# Patient Record
Sex: Male | Born: 1970 | Race: White | Hispanic: No | Marital: Married | Smoking: Never smoker
Health system: Southern US, Community
[De-identification: ages and names within clinical notes are randomized; demographics above are authoritative.]

## PROBLEM LIST (undated history)

## (undated) DIAGNOSIS — R112 Nausea with vomiting, unspecified: Secondary | ICD-10-CM

## (undated) DIAGNOSIS — I10 Essential (primary) hypertension: Secondary | ICD-10-CM

## (undated) DIAGNOSIS — T4145XA Adverse effect of unspecified anesthetic, initial encounter: Secondary | ICD-10-CM

## (undated) DIAGNOSIS — J45909 Unspecified asthma, uncomplicated: Secondary | ICD-10-CM

## (undated) DIAGNOSIS — Z9889 Other specified postprocedural states: Secondary | ICD-10-CM

## (undated) DIAGNOSIS — E119 Type 2 diabetes mellitus without complications: Secondary | ICD-10-CM

## (undated) DIAGNOSIS — B019 Varicella without complication: Secondary | ICD-10-CM

## (undated) DIAGNOSIS — R52 Pain, unspecified: Secondary | ICD-10-CM

## (undated) DIAGNOSIS — T8859XA Other complications of anesthesia, initial encounter: Secondary | ICD-10-CM

## (undated) DIAGNOSIS — G8929 Other chronic pain: Secondary | ICD-10-CM

## (undated) DIAGNOSIS — M549 Dorsalgia, unspecified: Secondary | ICD-10-CM

## (undated) HISTORY — PX: MYRINGOTOMY WITH TUBE PLACEMENT: SHX5663

## (undated) HISTORY — DX: Varicella without complication: B01.9

---
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
## 2014-03-05 LAB — CBC AND DIFFERENTIAL
HEMATOCRIT: 48 % (ref 41–53)
Hemoglobin: 15.3 g/dL (ref 13.5–17.5)
WBC: 8.8 10*3/mL

## 2014-03-05 LAB — LIPID PANEL
Cholesterol: 147 mg/dL (ref 0–200)
HDL: 39 mg/dL (ref 35–70)
LDL CALC: 76 mg/dL
Triglycerides: 162 mg/dL — AB (ref 40–160)

## 2014-03-05 LAB — TSH: TSH: 1.05 u[IU]/mL (ref 0.41–5.90)

## 2014-03-05 LAB — HEMOGLOBIN A1C: HEMOGLOBIN A1C: 8.6 % — AB (ref 4.0–6.0)

## 2014-03-05 LAB — BASIC METABOLIC PANEL
BUN: 13 mg/dL (ref 4–21)
CREATININE: 0.9 mg/dL (ref 0.6–1.3)
GLUCOSE: 252 mg/dL
Potassium: 4.6 mmol/L (ref 3.4–5.3)
Sodium: 133 mmol/L — AB (ref 137–147)

## 2014-03-05 LAB — HEPATIC FUNCTION PANEL
ALT: 42 U/L — AB (ref 10–40)
AST: 28 U/L (ref 14–40)
BILIRUBIN, TOTAL: 0.4 mg/dL

## 2014-11-12 ENCOUNTER — Encounter (HOSPITAL_COMMUNITY): Payer: Self-pay | Admitting: Nurse Practitioner

## 2014-11-12 ENCOUNTER — Emergency Department (HOSPITAL_COMMUNITY): Payer: 59

## 2014-11-12 ENCOUNTER — Emergency Department (HOSPITAL_COMMUNITY)
Admission: EM | Admit: 2014-11-12 | Discharge: 2014-11-13 | Disposition: A | Discharge status: Home / Self Care | Payer: 59 | Attending: Emergency Medicine | Admitting: Emergency Medicine

## 2014-11-12 DIAGNOSIS — I10 Essential (primary) hypertension: Secondary | ICD-10-CM | POA: Insufficient documentation

## 2014-11-12 DIAGNOSIS — R110 Nausea: Secondary | ICD-10-CM | POA: Diagnosis not present

## 2014-11-12 DIAGNOSIS — E1165 Type 2 diabetes mellitus with hyperglycemia: Secondary | ICD-10-CM | POA: Diagnosis not present

## 2014-11-12 DIAGNOSIS — J45901 Unspecified asthma with (acute) exacerbation: Secondary | ICD-10-CM | POA: Insufficient documentation

## 2014-11-12 DIAGNOSIS — R079 Chest pain, unspecified: Secondary | ICD-10-CM

## 2014-11-12 HISTORY — DX: Unspecified asthma, uncomplicated: J45.909

## 2014-11-12 HISTORY — DX: Type 2 diabetes mellitus without complications: E11.9

## 2014-11-12 HISTORY — DX: Essential (primary) hypertension: I10

## 2014-11-12 LAB — I-STAT TROPONIN, ED
Troponin i, poc: 0.01 ng/mL (ref 0.00–0.08)
Troponin i, poc: 0.01 ng/mL (ref 0.00–0.08)

## 2014-11-12 LAB — BASIC METABOLIC PANEL
Anion gap: 9 (ref 5–15)
BUN: 16 mg/dL (ref 6–23)
CALCIUM: 9.3 mg/dL (ref 8.4–10.5)
CHLORIDE: 102 mmol/L (ref 96–112)
CO2: 26 mmol/L (ref 19–32)
Creatinine, Ser: 1.23 mg/dL (ref 0.50–1.35)
GFR calc Af Amer: 83 mL/min — ABNORMAL LOW (ref >90)
GFR calc non Af Amer: 71 mL/min — ABNORMAL LOW (ref >90)
GLUCOSE: 296 mg/dL — AB (ref 70–99)
Potassium: 4.2 mmol/L (ref 3.5–5.1)
SODIUM: 137 mmol/L (ref 135–145)

## 2014-11-12 LAB — CBC
HCT: 43.6 % (ref 39.0–52.0)
Hemoglobin: 14.8 g/dL (ref 13.0–17.0)
MCH: 29.3 pg (ref 26.0–34.0)
MCHC: 33.9 g/dL (ref 30.0–36.0)
MCV: 86.3 fL (ref 78.0–100.0)
Platelets: 280 10*3/uL (ref 150–400)
RBC: 5.05 MIL/uL (ref 4.22–5.81)
RDW: 13.6 % (ref 11.5–15.5)
WBC: 8.6 10*3/uL (ref 4.0–10.5)

## 2014-11-12 NOTE — ED Notes (Signed)
Pt reports chest, upper abd, bilateral shoulder pain onset this afternoon while he was at rest. He denies any other complaints. Pain has persisted since onset

## 2014-11-12 NOTE — ED Provider Notes (Signed)
CSN: [id number]     Arrival date & time 11/12/14  1957 History   First MD Initiated Contact with Patient 11/12/14 2237     Chief Complaint  Patient presents with  . Chest Pain     (Consider location/radiation/quality/duration/timing/severity/associated sxs/prior Treatment) HPI  Pt is a 44yo male with hx of HTN, asthma, and NIDDM, presenting to ED with c/o intermittent diffuse chest pain that has been dull and aching, associated nausea and diaphoresis.  Pain was tightness in nature, 3/10 at worst.  Pain did radiate more to his left shoulder and upper back.  Pain has resolved after getting into exam room.  Pt states he was sitting when symptoms started earlier this afternoon. Reports feeling well yesterday.  Denies previous hx of CAD. States he moved to the area about 1 year ago from [place], has not f/u with a PCP but has his first appointment with [facility] primary care in April.  States his glucose normally is 140s but found out today it was elevated, 296. Pt reports having been on the [employer] and "doing really well" but admits this week he has "fallen off the diet" and eating "whatever he wants"  Which he believes has caused his glucose to be elevated and believes this too may be why he feels bad today. Denies fever, chills, vomiting or diarrhea.  Denies recent cough or congestion.  Pt reports his grandfather had an MI before age 39.   Past Medical History  Diagnosis Date  . Asthma   . Hypertension   . Diabetes mellitus without complication    Past Surgical History  Procedure Laterality Date  . Knee surgery     History reviewed. No pertinent family history. History  Substance Use Topics  . Smoking status: Never Smoker   . Smokeless tobacco: Current User    Types: Chew  . Alcohol Use: No    Review of Systems  Constitutional: Positive for diaphoresis. Negative for fever, chills and fatigue.  Respiratory: Negative for cough and shortness of breath.   Cardiovascular: Positive for  chest pain.  Gastrointestinal: Positive for nausea. Negative for vomiting, abdominal pain and diarrhea.  Musculoskeletal: Negative for back pain and neck pain.  All other systems reviewed and are negative.     Allergies  Review of patient's allergies indicates no known allergies.  Home Medications   Prior to Admission medications   Not on File   BP 122/62 mmHg  Pulse 75  Resp 13  SpO2 97% Physical Exam  Constitutional: He appears well-developed and well-nourished.  HENT:  Head: Normocephalic and atraumatic.  Eyes: Conjunctivae are normal. No scleral icterus.  Neck: Normal range of motion.  Cardiovascular: Normal rate, regular rhythm and normal heart sounds.   Pulmonary/Chest: Effort normal. No respiratory distress. He has wheezes. He has no rales. He exhibits no tenderness.  No respiratory distress, able to speak in full sentences w/o difficulty. Lungs: faint expiratory wheeze in upper lung fields. No accessory muscle use.   Abdominal: Soft. Bowel sounds are normal. He exhibits no distension and no mass. There is no tenderness. There is no rebound and no guarding.  Musculoskeletal: Normal range of motion.  Neurological: He is alert.  Skin: Skin is warm and dry.  Nursing note and vitals reviewed.   ED Course  Procedures (including critical care time) Labs Review Labs Reviewed  BASIC METABOLIC PANEL - Abnormal; Notable for the following:    Glucose, Bld 296 (*)    GFR calc non Af Amer 71 (*)  GFR calc Af Amer 83 (*)    All other components within normal limits  CBC  I-STAT TROPOININ, ED  Randolm Idol, ED    Imaging Review Dg Chest 2 View  11/12/2014   CLINICAL DATA:  Chest pain.  History of asthma.  EXAM: CHEST  2 VIEW  COMPARISON:  None.  FINDINGS: The heart size and mediastinal contours are within normal limits. Both lungs are clear. The visualized skeletal structures are unremarkable.  IMPRESSION: No active cardiopulmonary disease.   Electronically Signed    By: Markus Daft M.D.   On: 11/12/2014 20:45     EKG Interpretation   Date/Time:  Wednesday November 12 2014 20:07:17 EST Ventricular Rate:  96 PR Interval:  152 QRS Duration: 86 QT Interval:  322 QTC Calculation: 406 R Axis:   84 Text Interpretation:  Normal sinus rhythm with sinus arrhythmia Possible  Anterior infarct , age undetermined T wave abnormality, consider inferior  ischemia Abnormal ECG No old tracing to compare Confirmed by Debby Freiberg [phone]) on 11/12/2014 11:01:52 PM      MDM   Final diagnoses:  Chest pain, unspecified chest pain type  Hyperglycemia due to type 2 diabetes mellitus    Pt is a 44yo male with hx of NIDDM, HTN, and asthma c/o diffuse dull chest pain that radiated to left shoulder and upper back. No previous hx of CAD.  FH of CAD.  Pt does have faint wheeze on exam, pt anxious to go home after getting in exam room. Declined duoneb tx stating he has given himself a breathing treatment before bed in the past and it makes it difficult for him to sleep. States he does have plenty of inhalers at home he can use.   Initial cardiac workup Troponin negative for elevation, CXR: no active cardiopulmonary disease, EKG: normal sinus rhythm with sinus arrhythmia possible anterior infarct, age undetermined T wave abnormality, consider inferior ischemia. No old tracing to compare.  CBC and BMP only remarkable for elevated glucose of 296. Pt attributes to eating "whatever he wants" recently.  Hx of NIDDM.   Pt anxious to leave after initial cardiac workup. Expressed concern for CP that had associated nausea and diaphoresis.  Able to have pt stay for delta troponin.  If negative, pt will be discharged home but advised to call [facility] tomorrow to schedule a f/u appointment.    Delta troponin: negative for elevation. Will discharge pt home. Return precautions provided. Pt verbalized understanding and agreement with tx plan including f/u with cardiology and PCP.        Noland Fordyce, PA-C 11/12/14 2358  Debby Freiberg, MD 11/17/14 [phone]

## 2014-12-26 ENCOUNTER — Ambulatory Visit: Payer: Self-pay | Admitting: Family Medicine

## 2015-02-14 ENCOUNTER — Encounter (HOSPITAL_COMMUNITY): Payer: Self-pay | Admitting: Emergency Medicine

## 2015-02-14 ENCOUNTER — Emergency Department (HOSPITAL_COMMUNITY)
Admission: EM | Admit: 2015-02-14 | Discharge: 2015-02-14 | Disposition: A | Discharge status: Home / Self Care | Payer: 59 | Attending: Emergency Medicine | Admitting: Emergency Medicine

## 2015-02-14 DIAGNOSIS — I10 Essential (primary) hypertension: Secondary | ICD-10-CM | POA: Insufficient documentation

## 2015-02-14 DIAGNOSIS — E119 Type 2 diabetes mellitus without complications: Secondary | ICD-10-CM | POA: Diagnosis not present

## 2015-02-14 DIAGNOSIS — R4182 Altered mental status, unspecified: Secondary | ICD-10-CM | POA: Diagnosis present

## 2015-02-14 DIAGNOSIS — F1110 Opioid abuse, uncomplicated: Secondary | ICD-10-CM | POA: Diagnosis not present

## 2015-02-14 DIAGNOSIS — J45909 Unspecified asthma, uncomplicated: Secondary | ICD-10-CM | POA: Diagnosis not present

## 2015-02-14 DIAGNOSIS — F1310 Sedative, hypnotic or anxiolytic abuse, uncomplicated: Secondary | ICD-10-CM | POA: Insufficient documentation

## 2015-02-14 DIAGNOSIS — G8929 Other chronic pain: Secondary | ICD-10-CM | POA: Diagnosis not present

## 2015-02-14 DIAGNOSIS — F1190 Opioid use, unspecified, uncomplicated: Secondary | ICD-10-CM

## 2015-02-14 DIAGNOSIS — R5383 Other fatigue: Secondary | ICD-10-CM | POA: Insufficient documentation

## 2015-02-14 DIAGNOSIS — Z9189 Other specified personal risk factors, not elsewhere classified: Secondary | ICD-10-CM

## 2015-02-14 HISTORY — DX: Other chronic pain: G89.29

## 2015-02-14 HISTORY — DX: Dorsalgia, unspecified: M54.9

## 2015-02-14 LAB — ACETAMINOPHEN LEVEL

## 2015-02-14 LAB — COMPREHENSIVE METABOLIC PANEL WITH GFR
ALT: 40 U/L (ref 17–63)
AST: 30 U/L (ref 15–41)
Albumin: 4.3 g/dL (ref 3.5–5.0)
Alkaline Phosphatase: 69 U/L (ref 38–126)
Anion gap: 9 (ref 5–15)
BUN: 16 mg/dL (ref 6–20)
CO2: 27 mmol/L (ref 22–32)
Calcium: 9.2 mg/dL (ref 8.9–10.3)
Chloride: 104 mmol/L (ref 101–111)
Creatinine, Ser: 0.89 mg/dL (ref 0.61–1.24)
GFR calc Af Amer: >60 mL/min
GFR calc non Af Amer: >60 mL/min
Glucose, Bld: 185 mg/dL — ABNORMAL HIGH (ref 65–99)
Potassium: 4.1 mmol/L (ref 3.5–5.1)
Sodium: 140 mmol/L (ref 135–145)
Total Bilirubin: 0.5 mg/dL (ref 0.3–1.2)
Total Protein: 7.5 g/dL (ref 6.5–8.1)

## 2015-02-14 LAB — URINALYSIS, ROUTINE W REFLEX MICROSCOPIC
Bilirubin Urine: NEGATIVE
Glucose, UA: >1000 mg/dL — AB
Hgb urine dipstick: NEGATIVE
Ketones, ur: NEGATIVE mg/dL
Leukocytes, UA: NEGATIVE
Nitrite: NEGATIVE
Protein, ur: NEGATIVE mg/dL
SPECIFIC GRAVITY, URINE: 1.033 — AB (ref 1.005–1.030)
UROBILINOGEN UA: 0.2 mg/dL (ref 0.0–1.0)
pH: 6 (ref 5.0–8.0)

## 2015-02-14 LAB — CBC WITH DIFFERENTIAL/PLATELET
BASOS ABS: 0 10*3/uL (ref 0.0–0.1)
BASOS PCT: 0 % (ref 0–1)
EOS ABS: 0.3 10*3/uL (ref 0.0–0.7)
EOS PCT: 4 % (ref 0–5)
HCT: 43 % (ref 39.0–52.0)
Hemoglobin: 14.1 g/dL (ref 13.0–17.0)
Lymphocytes Relative: 26 % (ref 12–46)
Lymphs Abs: 1.7 10*3/uL (ref 0.7–4.0)
MCH: 29.1 pg (ref 26.0–34.0)
MCHC: 32.8 g/dL (ref 30.0–36.0)
MCV: 88.8 fL (ref 78.0–100.0)
Monocytes Absolute: 0.5 10*3/uL (ref 0.1–1.0)
Monocytes Relative: 8 % (ref 3–12)
NEUTROS ABS: 4.2 10*3/uL (ref 1.7–7.7)
Neutrophils Relative %: 62 % (ref 43–77)
Platelets: 244 10*3/uL (ref 150–400)
RBC: 4.84 MIL/uL (ref 4.22–5.81)
RDW: 13.2 % (ref 11.5–15.5)
WBC: 6.8 10*3/uL (ref 4.0–10.5)

## 2015-02-14 LAB — URINE MICROSCOPIC-ADD ON

## 2015-02-14 LAB — ETHANOL: Alcohol, Ethyl (B): <5 mg/dL (ref <5)

## 2015-02-14 LAB — RAPID URINE DRUG SCREEN, HOSP PERFORMED
Amphetamines: NOT DETECTED
Barbiturates: NOT DETECTED
Benzodiazepines: POSITIVE — AB
Cocaine: NOT DETECTED
Opiates: POSITIVE — AB
Tetrahydrocannabinol: NOT DETECTED

## 2015-02-14 LAB — CBG MONITORING, ED: Glucose-Capillary: 174 mg/dL — ABNORMAL HIGH (ref 65–99)

## 2015-02-14 LAB — AMMONIA: Ammonia: 36 umol/L — ABNORMAL HIGH (ref 9–35)

## 2015-02-14 MED ORDER — SODIUM CHLORIDE 0.9 % IV BOLUS (SEPSIS)
1000.0000 mL | Freq: Once | INTRAVENOUS | Status: AC
  Administered 2015-02-14: 1000 mL via INTRAVENOUS
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
## 2015-02-14 NOTE — Discharge Instructions (Signed)
If you were given medicines take as directed.  If you are on coumadin or contraceptives realize their levels and effectiveness is altered by many different medicines.  If you have any reaction (rash, tongues swelling, other) to the medicines stop taking and see a physician.    If your blood pressure was elevated in the ER make sure you follow up for management with a primary doctor or return for chest pain, shortness of breath or stroke symptoms.  Please follow up as directed and return to the ER or see a physician for new or worsening symptoms.  Thank you. Filed Vitals:   02/14/15 1823  BP: 145/86  Pulse: 96  Temp: 98.8 F (37.1 C)  TempSrc: Oral  Resp: 14  SpO2: 96%

## 2015-04-28 ENCOUNTER — Ambulatory Visit: Payer: 59 | Admitting: Family Medicine

## 2015-05-12 ENCOUNTER — Ambulatory Visit (INDEPENDENT_AMBULATORY_CARE_PROVIDER_SITE_OTHER): Payer: 59 | Admitting: Family Medicine

## 2015-05-12 ENCOUNTER — Encounter: Payer: Self-pay | Admitting: Family Medicine

## 2015-05-12 VITALS — BP 120/74 | HR 77 | Temp 97.7°F | Ht 68.0 in | Wt 334.0 lb

## 2015-05-12 DIAGNOSIS — I10 Essential (primary) hypertension: Secondary | ICD-10-CM | POA: Diagnosis not present

## 2015-05-12 DIAGNOSIS — N508 Other specified disorders of male genital organs: Secondary | ICD-10-CM

## 2015-05-12 DIAGNOSIS — G8929 Other chronic pain: Secondary | ICD-10-CM

## 2015-05-12 DIAGNOSIS — E119 Type 2 diabetes mellitus without complications: Secondary | ICD-10-CM | POA: Diagnosis not present

## 2015-05-12 DIAGNOSIS — R1030 Lower abdominal pain, unspecified: Secondary | ICD-10-CM

## 2015-05-12 DIAGNOSIS — M549 Dorsalgia, unspecified: Secondary | ICD-10-CM

## 2015-05-12 DIAGNOSIS — J4530 Mild persistent asthma, uncomplicated: Secondary | ICD-10-CM

## 2015-05-12 DIAGNOSIS — N50811 Right testicular pain: Secondary | ICD-10-CM

## 2015-05-12 DIAGNOSIS — R1031 Right lower quadrant pain: Secondary | ICD-10-CM

## 2015-05-12 DIAGNOSIS — J45909 Unspecified asthma, uncomplicated: Secondary | ICD-10-CM | POA: Insufficient documentation

## 2015-05-12 LAB — HEMOGLOBIN A1C: HEMOGLOBIN A1C: 8.5 % — AB (ref 4.6–6.5)

## 2015-05-12 LAB — COMPREHENSIVE METABOLIC PANEL
ALBUMIN: 4.2 g/dL (ref 3.5–5.2)
ALK PHOS: 72 U/L (ref 39–117)
ALT: 35 U/L (ref 0–53)
AST: 24 U/L (ref 0–37)
BUN: 18 mg/dL (ref 6–23)
CALCIUM: 9.2 mg/dL (ref 8.4–10.5)
CHLORIDE: 98 meq/L (ref 96–112)
CO2: 27 mEq/L (ref 19–32)
CREATININE: 0.91 mg/dL (ref 0.40–1.50)
GFR: 97.03 mL/min (ref >60.00)
Glucose, Bld: 291 mg/dL — ABNORMAL HIGH (ref 70–99)
POTASSIUM: 4.1 meq/L (ref 3.5–5.1)
Sodium: 133 mEq/L — ABNORMAL LOW (ref 135–145)
TOTAL PROTEIN: 7.4 g/dL (ref 6.0–8.3)
Total Bilirubin: 0.5 mg/dL (ref 0.2–1.2)

## 2015-05-12 MED ORDER — MONTELUKAST SODIUM 10 MG PO TABS: 10.0000 mg | ORAL_TABLET | Freq: Every day | ORAL | Status: DC

## 2015-05-12 MED ORDER — METFORMIN HCL 1000 MG PO TABS: 1000.0000 mg | ORAL_TABLET | Freq: Two times a day (BID) | ORAL | Status: DC

## 2015-05-12 MED ORDER — GLIMEPIRIDE 4 MG PO TABS: 4.0000 mg | ORAL_TABLET | Freq: Every morning | ORAL | Status: DC

## 2015-05-12 MED ORDER — BECLOMETHASONE DIPROPIONATE 80 MCG/ACT IN AERS: 1.0000 | INHALATION_SPRAY | Freq: Two times a day (BID) | RESPIRATORY_TRACT | Status: DC

## 2015-05-12 MED ORDER — LOSARTAN POTASSIUM 50 MG PO TABS: 50.0000 mg | ORAL_TABLET | Freq: Every day | ORAL | Status: DC

## 2015-05-12 MED ORDER — HYDROCHLOROTHIAZIDE 12.5 MG PO CAPS
12.5000 mg | ORAL_CAPSULE | Freq: Every day | ORAL | Status: DC
Start: 2015-05-12 — End: 2015-12-17

## 2015-05-12 MED ORDER — ALBUTEROL SULFATE HFA 108 (90 BASE) MCG/ACT IN AERS: 2.0000 | INHALATION_SPRAY | Freq: Four times a day (QID) | RESPIRATORY_TRACT | Status: DC | PRN

## 2015-05-12 NOTE — Assessment & Plan Note (Signed)
S: controlled. On Losartan 50mg , hctz 12.5mg  A/P:Continue current meds:  Refilled meds.

## 2015-05-12 NOTE — Assessment & Plan Note (Signed)
Chronic back pain- managed at pain management in [facility]. 15mg  oxycodone every 8 hours. Bulging disc. Not interested in surgery currently.

## 2015-05-12 NOTE — Progress Notes (Signed)
Henry Reddish, MD Phone: [phone]  Subjective:  Patient presents today to establish care. Moved to [facility] March 2015. Previously cared for in [place], [place]. Also has staff physician at [facility]. Chief complaint-noted.   See problem oriented charting5-6 hours sleep a night- had been on xanax in past ROS- no chest pain, shortness of breath, blurry vision, headache, nausea, hypoglycemia symptoms  The following were reviewed and entered/updated in epic: Past Medical History  Diagnosis Date  . Asthma   . Hypertension     hctz 12.5mg   . Diabetes mellitus without complication   . Chronic back pain   . Chicken pox    Patient Active Problem List   Diagnosis Date Noted  . Diabetes mellitus without complication     Priority: High  . Chronic back pain     Priority: High  . Asthma     Priority: Medium  . Hypertension     Priority: Medium   Past Surgical History  Procedure Laterality Date  . Knee surgery  1985    broken femur  . Appendectomy  1980    Family History  Problem Relation Age of Onset  . Hypertension Father   . Diabetes Father   . Diabetes Maternal Grandmother     type I  . Heart disease Paternal Grandfather     31s  . Liver cancer Paternal Grandfather     former drinker  . Heart disease Sister     MI? no autopsy before 50    Medications- reviewed and updated Current Outpatient Prescriptions  Medication Sig Dispense Refill  . aspirin 81 MG tablet Take 81 mg by mouth daily.    Marland Kitchen glimepiride (AMARYL) 4 MG tablet Take 1 tablet (4 mg total) by mouth every morning. 30 tablet 5  . hydrochlorothiazide (MICROZIDE) 12.5 MG capsule Take 1 capsule (12.5 mg total) by mouth daily. 30 capsule 5  . losartan (COZAAR) 50 MG tablet Take 1 tablet (50 mg total) by mouth daily. 30 tablet 5  . metFORMIN (GLUCOPHAGE) 1000 MG tablet Take 1 tablet (1,000 mg total) by mouth 2 (two) times daily. 60 tablet 5  . montelukast (SINGULAIR) 10 MG tablet Take 1 tablet (10 mg total) by  mouth at bedtime. 30 tablet 5  . albuterol (PROVENTIL HFA;VENTOLIN HFA) 108 (90 BASE) MCG/ACT inhaler Inhale 2 puffs into the lungs every 6 (six) hours as needed. Shortness of breath/wheezing 6.7 g 5  . beclomethasone (QVAR) 80 MCG/ACT inhaler Inhale 1 puff into the lungs 2 (two) times daily. 1 Inhaler 5  . oxyCODONE (ROXICODONE) 15 MG immediate release tablet Take 1 tablet by mouth every 8 (eight) hours as needed. Severe pain  0   No current facility-administered medications for this visit.   Allergies-reviewed and updated No Known Allergies  Social History   Social History  . Marital Status: Married    Spouse Name: N/A  . Number of Children: N/A  . Years of Education: N/A   Social History Main Topics  . Smoking status: Never Smoker   . Smokeless tobacco: Current User    Types: Chew  . Alcohol Use: No     Comment: rare social  . Drug Use: No  . Sexual Activity: Not Asked   Other Topics Concern  . None   Social History Narrative   Married. No children. 1 cat.       Works as Chief Financial Officer for [employer]. At [place]    ROS--See  HPI , otherwise full ROS was completed and negative except as noted above  Objective: BP 120/74 mmHg  Pulse 77  Temp(Src) 97.7 F (36.5 C)  Ht 5\' 8"  (1.727 m)  Wt 334 lb (151.501 kg)  BMI 50.80 kg/m2 Gen: NAD, resting comfortably HEENT: Mucous membranes are moist. Oropharynx normal. TM with some prior scarring.  Eyes: sclera and lids normal, PERRLA Neck: no thyromegaly, no cervical lymphadenopathy CV: RRR no murmurs rubs or gallops Lungs: CTAB no crackles, wheeze, rhonchi Abdomen: soft/nontender/nondistended/normal bowel sounds. No rebound or guarding.  GU: declines testicular exam, allows exam over boxers- tender to palpation to the right and above his penis in the groin, no issues on left side. No obvious hernia or bulge.  MSK- no pain with IR, ER, Flexion of hip.  Ext: no edema, 2+ PT pulses Skin:  warm, dry Small horned lesion with white material with flesh colored base on left neck Neuro: 5/5 strength in upper and lower extremities, normal gait, normal reflexes   Assessment/Plan:  Hypertension S: controlled. On Losartan 50mg , hctz 12.5mg  A/P:Continue current meds:  Refilled meds.   Diabetes mellitus without complication S: poor control. Sparing checks. Eats well around 100, eat poorly 200-240. Admits to eating very poorly recently. Not exercising- works 60 hours a week. Not interested in nutritionist. States he knows what he needs to do it is just boring and he doesn't want to do it Lab Results  Component Value Date   HGBA1C 8.5* 05/12/2015  A/P:  im surprised a1c is not worse than this with reported home readings. Continue amaryl 4mg , metformin 1g BID. Patient wants to buckle down on healthy lifestyles. If not <7.5 in 3 months, consider amaryl 8mg  vs. dpp4 or sglt2 inhibitor.    Chronic back pain Chronic back pain- managed at pain management in [facility]. 15mg  oxycodone every 8 hours. Bulging disc. Not interested in surgery currently.   Asthma S: controlled on singulair, albuterol prn with Albuterol 2x a month.  A/P: discussed likely did not need qvar given overall good control, could consider taking off med list in future, for now- refilled all 3 meds   Right testicular pain S: patient for 7-8 years has had right groin pain. Has been evaluated and no obvious hernia. He also states if he touches his right testicle it is painful.  A/P: oddly, patient did not want to do a GU exam today but was interested in urology referral for this chronic pain. Could be coming from the back but odd to involve testicle. Regardless, referred to urology at this time.     Get records from prior PCP. Patient may have pain management send [place] a copy of MRI as well.   Orders Placed This Encounter  Procedures  . Comprehensive metabolic panel    [place]  . Hemoglobin A1c    [place]  .  Ambulatory referral to Urology    Referral Priority:  Routine    Referral Type:  Consultation    Referral Reason:  Specialty Services Required    Requested Specialty:  Urology    Number of Visits Requested:  1    Meds ordered this encounter  Medications  . aspirin 81 MG tablet    Sig: Take 81 mg by mouth daily.  . montelukast (SINGULAIR) 10 MG tablet    Sig: Take 1 tablet (10 mg total) by mouth at bedtime.    Dispense:  30 tablet    Refill:  5  . metFORMIN (GLUCOPHAGE) 1000 MG tablet  Sig: Take 1 tablet (1,000 mg total) by mouth 2 (two) times daily.    Dispense:  60 tablet    Refill:  5  . losartan (COZAAR) 50 MG tablet    Sig: Take 1 tablet (50 mg total) by mouth daily.    Dispense:  30 tablet    Refill:  5  . hydrochlorothiazide (MICROZIDE) 12.5 MG capsule    Sig: Take 1 capsule (12.5 mg total) by mouth daily.    Dispense:  30 capsule    Refill:  5  . glimepiride (AMARYL) 4 MG tablet    Sig: Take 1 tablet (4 mg total) by mouth every morning.    Dispense:  30 tablet    Refill:  5  . beclomethasone (QVAR) 80 MCG/ACT inhaler    Sig: Inhale 1 puff into the lungs 2 (two) times daily.    Dispense:  1 Inhaler    Refill:  5  . albuterol (PROVENTIL HFA;VENTOLIN HFA) 108 (90 BASE) MCG/ACT inhaler    Sig: Inhale 2 puffs into the lungs every 6 (six) hours as needed. Shortness of breath/wheezing    Dispense:  6.7 g    Refill:  5

## 2015-05-12 NOTE — Assessment & Plan Note (Signed)
S: poor control. Sparing checks. Eats well around 100, eat poorly 200-240. Admits to eating very poorly recently. Not exercising- works 60 hours a week. Not interested in nutritionist. States he knows what he needs to do it is just boring and he doesn't want to do it Lab Results  Component Value Date   HGBA1C 8.5* 05/12/2015  A/P:  im surprised a1c is not worse than this with reported home readings. Continue amaryl 4mg , metformin 1g BID. Patient wants to buckle down on healthy lifestyles. If not <7.5 in 3 months, consider amaryl 8mg  vs. dpp4 or sglt2 inhibitor.

## 2015-05-12 NOTE — Patient Instructions (Signed)
We will call you within a week about your referral to urology for right testicular pain. If you do not hear within 2 weeks, give [place] a call.   You can schedule a procedure visit for [place] to remove the lesion on your neck. This can be a 15 minute visit.   Follow up in 3 months to check in on diabetes, suspicious a1c is going to be elevated above 7. Goal <7.  Recommend diet with 3-5 servings veggies per day, regular exercise, weight loss. Offered nutritionist and you declined- offer remains open if you change your mind.

## 2015-05-12 NOTE — Assessment & Plan Note (Signed)
S: controlled on singulair, albuterol prn with Albuterol 2x a month.  A/P: discussed likely did not need qvar given overall good control, could consider taking off med list in future, for now- refilled all 3 meds

## 2015-05-12 NOTE — Assessment & Plan Note (Signed)
S: patient for 7-8 years has had right groin pain. Has been evaluated and no obvious hernia. He also states if he touches his right testicle it is painful.  A/P: oddly, patient did not want to do a GU exam today but was interested in urology referral for this chronic pain. Could be coming from the back but odd to involve testicle. Regardless, referred to urology at this time.

## 2015-05-22 ENCOUNTER — Encounter: Payer: Self-pay | Admitting: Family Medicine

## 2015-06-09 ENCOUNTER — Telehealth: Payer: Self-pay | Admitting: Family Medicine

## 2015-06-09 NOTE — Telephone Encounter (Signed)
When he was here, I understood he was taking 50mg . If he was in fact taking 100mg  at time of visit, can change to either 100mg  pill or 2 of the 50mg  pills at a time per his preference

## 2015-06-09 NOTE — Telephone Encounter (Signed)
Pt needs clarification on dosage for losartan (COZAAR) 50 MG tablet... Pt was previously taking 2 per day but new RX is only for 1 per day. He states he was told during visit that there would be no changes. Please call Pt back to clarify.

## 2015-06-09 NOTE — Telephone Encounter (Signed)
Pt needs clarification on the RX losartan (COZAAR) 50 MG tablet... Pt was previously taking 2 per day and new RX is only for 1 per day. Please contact patient to clarify as he was previously told there were no changes.

## 2015-06-09 NOTE — Telephone Encounter (Signed)
Did you make this change to once daily from BID? I did not see any change documented or is this an error on the instructions?

## 2015-06-10 NOTE — Telephone Encounter (Signed)
Lm for pt tcb. 

## 2015-06-11 NOTE — Telephone Encounter (Signed)
Left another message for pt to return call

## 2015-06-12 MED ORDER — LOSARTAN POTASSIUM 50 MG PO TABS: 50.0000 mg | ORAL_TABLET | Freq: Two times a day (BID) | ORAL | Status: DC

## 2015-06-12 NOTE — Telephone Encounter (Signed)
Resent in Rx with correct instructions.

## 2015-08-10 ENCOUNTER — Other Ambulatory Visit: Payer: Self-pay | Admitting: Urology

## 2015-08-10 DIAGNOSIS — N50811 Right testicular pain: Secondary | ICD-10-CM

## 2015-08-10 DIAGNOSIS — N509 Disorder of male genital organs, unspecified: Secondary | ICD-10-CM

## 2015-08-11 ENCOUNTER — Ambulatory Visit: Payer: 59 | Admitting: Family Medicine

## 2015-09-03 ENCOUNTER — Ambulatory Visit (HOSPITAL_COMMUNITY)
Admission: RE | Admit: 2015-09-03 | Discharge: 2015-09-03 | Disposition: A | Discharge status: Home / Self Care | Payer: 59 | Source: Ambulatory Visit | Attending: Urology | Admitting: Urology

## 2015-09-03 ENCOUNTER — Other Ambulatory Visit: Payer: Self-pay | Admitting: Urology

## 2015-09-03 DIAGNOSIS — N50811 Right testicular pain: Secondary | ICD-10-CM | POA: Diagnosis present

## 2015-09-03 DIAGNOSIS — N503 Cyst of epididymis: Secondary | ICD-10-CM | POA: Diagnosis not present

## 2015-09-03 DIAGNOSIS — N509 Disorder of male genital organs, unspecified: Secondary | ICD-10-CM

## 2015-09-03 DIAGNOSIS — N433 Hydrocele, unspecified: Secondary | ICD-10-CM | POA: Insufficient documentation

## 2015-09-08 ENCOUNTER — Ambulatory Visit: Payer: 59 | Admitting: Family Medicine

## 2015-09-16 ENCOUNTER — Telehealth: Payer: Self-pay | Admitting: Family Medicine

## 2015-09-16 NOTE — Telephone Encounter (Signed)
FYI;  I used SDA 3:45 on 1.16.17 pt said he has an appt with the Urologist on Friday 09/18/15 to discuss test results. He reschedule his Friday appt to Monday 09/21/15 so that he can discuss his test results with Dr Yong Channel.

## 2015-09-18 ENCOUNTER — Ambulatory Visit: Payer: 59 | Admitting: Family Medicine

## 2015-09-21 ENCOUNTER — Ambulatory Visit (INDEPENDENT_AMBULATORY_CARE_PROVIDER_SITE_OTHER): Payer: 59 | Admitting: Family Medicine

## 2015-09-21 ENCOUNTER — Encounter: Payer: Self-pay | Admitting: Family Medicine

## 2015-09-21 VITALS — BP 124/80 | HR 84 | Temp 97.7°F | Wt 330.0 lb

## 2015-09-21 DIAGNOSIS — M549 Dorsalgia, unspecified: Secondary | ICD-10-CM | POA: Diagnosis not present

## 2015-09-21 DIAGNOSIS — G8929 Other chronic pain: Secondary | ICD-10-CM

## 2015-09-21 DIAGNOSIS — E119 Type 2 diabetes mellitus without complications: Secondary | ICD-10-CM | POA: Diagnosis not present

## 2015-09-21 DIAGNOSIS — I10 Essential (primary) hypertension: Secondary | ICD-10-CM | POA: Diagnosis not present

## 2015-09-21 LAB — POCT GLYCOSYLATED HEMOGLOBIN (HGB A1C): HEMOGLOBIN A1C: 10.2

## 2015-09-21 MED ORDER — OXYCODONE HCL 5 MG PO TABS: 5.0000 mg | ORAL_TABLET | ORAL | Status: DC | PRN

## 2015-09-21 MED ORDER — DAPAGLIFLOZIN PROPANEDIOL 5 MG PO TABS: 5.0000 mg | ORAL_TABLET | Freq: Every day | ORAL | Status: DC

## 2015-09-21 NOTE — Progress Notes (Signed)
Garret Reddish, MD  Subjective:  Henry Klein is a 45 y.o. year old very pleasant male patient who presents for/with See problem oriented charting ROS- has noted increased thirst along with higher blood sugars, denies hypoglycemia, has lost 4 lbs but could be from dehydration from high sugars  Past Medical History-  Patient Active Problem List   Diagnosis Date Noted  . Diabetes mellitus without complication ([place])     Priority: High  . Chronic back pain     Priority: High  . Asthma     Priority: Medium  . Hypertension     Priority: Medium  . Right testicular pain 05/12/2015    Medications- reviewed and updated Current Outpatient Prescriptions  Medication Sig Dispense Refill  . aspirin 81 MG tablet Take 81 mg by mouth daily.    Marland Kitchen glimepiride (AMARYL) 4 MG tablet Take 1 tablet (4 mg total) by mouth every morning. 30 tablet 5  . hydrochlorothiazide (MICROZIDE) 12.5 MG capsule Take 1 capsule (12.5 mg total) by mouth daily. 30 capsule 5  . losartan (COZAAR) 50 MG tablet Take 1 tablet (50 mg total) by mouth 2 (two) times daily. 60 tablet 5  . metFORMIN (GLUCOPHAGE) 1000 MG tablet Take 1 tablet (1,000 mg total) by mouth 2 (two) times daily. 60 tablet 5  . montelukast (SINGULAIR) 10 MG tablet Take 1 tablet (10 mg total) by mouth at bedtime. 30 tablet 5  . albuterol (PROVENTIL HFA;VENTOLIN HFA) 108 (90 BASE) MCG/ACT inhaler Inhale 2 puffs into the lungs every 6 (six) hours as needed. Shortness of breath/wheezing (Patient not taking: Reported on 09/21/2015) 6.7 g 5  . beclomethasone (QVAR) 80 MCG/ACT inhaler Inhale 1 puff into the lungs 2 (two) times daily. (Patient not taking: Reported on 09/21/2015) 1 Inhaler 5  . dapagliflozin propanediol (FARXIGA) 5 MG TABS tablet Take 5 mg by mouth daily. 30 tablet 3  . oxyCODONE (OXY IR/ROXICODONE) 10 MG immediate release tablet Take 1 tablet (10 mg total) by mouth every 8 (four) hours as needed for severe pain. 45 tablet 0   No current  facility-administered medications for this visit.    Objective: BP 124/80 mmHg  Pulse 84  Temp(Src) 97.7 F (36.5 C)  Wt 330 lb (149.687 kg) Gen: NAD, resting comfortably CV: RRR no murmurs rubs or gallops Lungs: CTAB no crackles, wheeze, rhonchi Abdomen: soft/nontender/nondistended/normal bowel sounds. No rebound or guarding. Morbidly obese Ext: no edema Skin: warm, dry Neuro: grossly normal, moves all extremities  Assessment/Plan:  Chronic back pain S: Patient had been managed by pain management in [place] by Redgie Grayer, NP since august with oxycodone q8 hours #90 monthly. This has been for chronic back pain from a bulging disc. He had not been interested in surgery. He had a family member encourage him to trial zubsolv through [place] pain management Dr. Cher Nakai Wa. He was on zubsolv for approximately a month when out of oxycodone. Stated he had worsening in pain. Returned to [facility] but was then told insurance was no longer accepted there. He was given #45 on 13/30/16 and has been trying to take BID to help them last while he transitions A/P: Patient would like to find office that will accept his insurance. Referred at this time. i did provide him #30 of oxycodone 5mg  to be used BID whiel we are getting records from [facility]- I am likely to prescribe short term for him to help with transition to new pain management.    Diabetes  mellitus without complication ([place]) S: very poorly controlled. On amaryl 4mg , metformin 1g BID CBGs- usually mid 200s when he has checked Exercise and diet- no exercise, overeating over holidays with stress from knowing may have testicular cancer but at minimum is likely to have right testicle removed  Lab Results  Component Value Date   HGBA1C 10.2 09/21/2015   HGBA1C 8.5* 05/12/2015   HGBA1C 8.6* 03/05/2014   A/P: patient needs to start insulin and advised. He has trialed before and does not do well with needles. Wants 1  more chance with orals. We will add farxiga 5mg  and follow up 3 months for repeat a1c- if below 8.5 with medicine and lifestyle changes may continue to trial to get down- if not would heavily push for insulin restart.    Hypertension S: controlled. On Losartan 50mg  BID , hctz 12.5mg   BP Readings from Last 3 Encounters:  09/21/15 124/80  05/12/15 120/74  02/14/15 132/53  A/P:Continue current medications- doing well  In regards to potential surgery, i sable to complete 4 mets without chest pain or shortness of breath. May have outpatient right orchiectomy.  Return precautions advised.   Orders Placed This Encounter  Procedures  . Ambulatory referral to Pain Clinic    Referral Priority:  Routine    Referral Type:  Consultation    Referral Reason:  Specialty Services Required    Requested Specialty:  Pain Medicine    Number of Visits Requested:  1  . POC HgB A1c   Drug database reviewed with patient- consistent with his story Meds ordered this encounter  Medications  . dapagliflozin propanediol (FARXIGA) 5 MG TABS tablet    Sig: Take 5 mg by mouth daily.    Dispense:  30 tablet    Refill:  3  . oxyCODONE (OXY IR/ROXICODONE) 5 MG immediate release tablet    Sig: Take 1 tablet (5 mg total) by mouth every 4 (four) hours as needed for severe pain.    Dispense:  30 tablet    Refill:  0

## 2015-09-21 NOTE — Assessment & Plan Note (Signed)
S: Patient had been managed by pain management in [place] by Redgie Grayer, NP since august with oxycodone q8 hours #90 monthly. This has been for chronic back pain from a bulging disc. He had not been interested in surgery. He had a family member encourage him to trial zubsolv through [place] pain management Dr. Cher Nakai Wa. He was on zubsolv for approximately a month when out of oxycodone. Stated he had worsening in pain. Returned to [facility] but was then told insurance was no longer accepted there. He was given #45 on 13/30/16 and has been trying to take BID to help them last while he transitions A/P: Patient would like to find office that will accept his insurance. Referred at this time. i did provide him #30 of oxycodone 5mg  to be used BID whiel we are getting records from [facility]- I am likely to prescribe short term for him to help with transition to new pain management.

## 2015-09-21 NOTE — Assessment & Plan Note (Signed)
S: very poorly controlled. On amaryl 4mg , metformin 1g BID CBGs- usually mid 200s when he has checked Exercise and diet- no exercise, overeating over holidays with stress from knowing may have testicular cancer but at minimum is likely to have right testicle removed  Lab Results  Component Value Date   HGBA1C 10.2 09/21/2015   HGBA1C 8.5* 05/12/2015   HGBA1C 8.6* 03/05/2014   A/P: patient needs to start insulin and advised. He has trialed before and does not do well with needles. Wants 1 more chance with orals. We will add farxiga 5mg  and follow up 3 months for repeat a1c- if below 8.5 with medicine and lifestyle changes may continue to trial to get down- if not would heavily push for insulin restart.

## 2015-09-21 NOTE — Patient Instructions (Addendum)
Get eye exam schedule and have results faxed to [place] at [phone].  Diabetes Lab Results  Component Value Date   HGBA1C 10.2 09/21/2015  Goal for diabetes is 7 or less. I am hopeful we can get at least a point down with new medicine but honestly the rest is likely up to you. Really need to get regular exercise and improve eating habits. If we cannot get this down, we are on a fast path to insulin -Take farxiga 5mg  in the morning with or without food  I would let urology know your a1c is 10.2 before they schedule surgery  Pain management I gave you oxycodone #30 today. I want you to use 2-3x a day maximum.  Sign release of information at the check out desk for records from prior pain clinic We will call you within a week about your referral to pain management. If you do not hear within 2 weeks, give [place] a call.

## 2015-09-21 NOTE — Assessment & Plan Note (Signed)
S: controlled. On Losartan 50mg  BID , hctz 12.5mg   BP Readings from Last 3 Encounters:  09/21/15 124/80  05/12/15 120/74  02/14/15 132/53  A/P:Continue current medications- doing well

## 2015-09-30 ENCOUNTER — Other Ambulatory Visit: Payer: Self-pay | Admitting: Urology

## 2015-10-02 ENCOUNTER — Telehealth: Payer: Self-pay | Admitting: Family Medicine

## 2015-10-02 NOTE — Telephone Encounter (Signed)
Henry Laming do you know the status of pain management referral?

## 2015-10-02 NOTE — Telephone Encounter (Signed)
This was just filled on 09/21/15, please advise.

## 2015-10-02 NOTE — Telephone Encounter (Signed)
Pt request refill of the following: oxyCODONE (OXY IR/ROXICODONE) 5 MG immediate release tablet,    Phamacy: [facility] [address]

## 2015-10-02 NOTE — Telephone Encounter (Signed)
whats status of records- we need these as well as update on appointment with pain management

## 2015-10-07 NOTE — Patient Instructions (Addendum)
Waclaw Pene Monrreal  10/07/2015   Your procedure is scheduled on: 10-16-15  Report to [facility]  Entrance take [facility]  elevators to 3rd floor to  [place] at [place] AM.  Call this number if you have problems the morning of surgery [phone]   Remember: ONLY 1 PERSON MAY GO WITH YOU TO SHORT STAY TO GET  READY MORNING OF [place].  Do not eat food or drink liquids :After Midnight.     Take these medicines the morning of surgery with A SIP OF WATER: Pain med if need DO NOT TAKE ANY DIABETIC MEDICATIONS DAY OF YOUR SURGERY                               You may not have any metal on your body including hair pins and              piercings  Do not wear jewelry, make-up, lotions, powders or perfumes, deodorant             Do not wear nail polish.  Do not shave  48 hours prior to surgery.              Men may shave face and neck.   Do not bring valuables to the hospital. [place].  Contacts, dentures or bridgework may not be worn into surgery.  Leave suitcase in the car. After surgery it may be brought to your room.     Patients discharged the day of surgery will not be allowed to drive home.  Name and phone number of your driver: Doreatha Martin [phone]  Special Instructions: N/A              Please read over the following fact sheets you were given: _____________________________________________________________________             [facility] - Preparing for Surgery Before surgery, you can play an important role.  Because skin is not sterile, your skin needs to be as free of germs as possible.  You can reduce the number of germs on your skin by washing with CHG (chlorahexidine gluconate) soap before surgery.  CHG is an antiseptic cleaner which kills germs and bonds with the skin to continue killing germs even after washing. Please DO NOT use if you have an allergy to CHG or antibacterial soaps.  If  your skin becomes reddened/irritated stop using the CHG and inform your nurse when you arrive at Short Stay. Do not shave (including legs and underarms) for at least 48 hours prior to the first CHG shower.  You may shave your face/neck. Please follow these instructions carefully:  1.  Shower with CHG Soap the night before surgery and the  morning of Surgery.  2.  If you choose to wash your hair, wash your hair first as usual with your  normal  shampoo.  3.  After you shampoo, rinse your hair and body thoroughly to remove the  shampoo.                           4.  Use CHG as you would any other liquid soap.  You can apply chg directly  to the  skin and wash                       Gently with a scrungie or clean washcloth.  5.  Apply the CHG Soap to your body ONLY FROM THE NECK DOWN.   Do not use on face/ open                           Wound or open sores. Avoid contact with eyes, ears mouth and genitals (private parts).                       Wash face,  Genitals (private parts) with your normal soap.             6.  Wash thoroughly, paying special attention to the area where your surgery  will be performed.  7.  Thoroughly rinse your body with warm water from the neck down.  8.  DO NOT shower/wash with your normal soap after using and rinsing off  the CHG Soap.                9.  Pat yourself dry with a clean towel.            10.  Wear clean pajamas.            11.  Place clean sheets on your bed the night of your first shower and do not  sleep with pets. Day of Surgery : Do not apply any lotions/deodorants the morning of surgery.  Please wear clean clothes to the hospital/surgery center.  FAILURE TO FOLLOW THESE INSTRUCTIONS MAY RESULT IN THE CANCELLATION OF YOUR SURGERY PATIENT SIGNATURE_________________________________  NURSE SIGNATURE__________________________________  ________________________________________________________________________

## 2015-10-08 ENCOUNTER — Encounter (HOSPITAL_COMMUNITY): Payer: Self-pay

## 2015-10-08 ENCOUNTER — Encounter (HOSPITAL_COMMUNITY)
Admission: RE | Admit: 2015-10-08 | Discharge: 2015-10-08 | Disposition: A | Discharge status: Home / Self Care | Payer: 59 | Source: Ambulatory Visit | Attending: Urology | Admitting: Urology

## 2015-10-08 DIAGNOSIS — N509 Disorder of male genital organs, unspecified: Secondary | ICD-10-CM | POA: Insufficient documentation

## 2015-10-08 DIAGNOSIS — N50811 Right testicular pain: Secondary | ICD-10-CM | POA: Diagnosis not present

## 2015-10-08 DIAGNOSIS — Z01812 Encounter for preprocedural laboratory examination: Secondary | ICD-10-CM | POA: Insufficient documentation

## 2015-10-08 HISTORY — DX: Nausea with vomiting, unspecified: R11.2

## 2015-10-08 HISTORY — DX: Pain, unspecified: R52

## 2015-10-08 HISTORY — DX: Other specified postprocedural states: Z98.890

## 2015-10-08 HISTORY — DX: Adverse effect of unspecified anesthetic, initial encounter: T41.45XA

## 2015-10-08 HISTORY — DX: Other complications of anesthesia, initial encounter: T88.59XA

## 2015-10-08 LAB — BASIC METABOLIC PANEL
Anion gap: 10 (ref 5–15)
BUN: 16 mg/dL (ref 6–20)
CALCIUM: 9.4 mg/dL (ref 8.9–10.3)
CHLORIDE: 100 mmol/L — AB (ref 101–111)
CO2: 26 mmol/L (ref 22–32)
CREATININE: 0.91 mg/dL (ref 0.61–1.24)
GFR calc Af Amer: >60 mL/min (ref >60)
GFR calc non Af Amer: >60 mL/min (ref >60)
GLUCOSE: 168 mg/dL — AB (ref 65–99)
Potassium: 4.2 mmol/L (ref 3.5–5.1)
Sodium: 136 mmol/L (ref 135–145)

## 2015-10-08 LAB — CBC
HCT: 44.6 % (ref 39.0–52.0)
Hemoglobin: 14.9 g/dL (ref 13.0–17.0)
MCH: 29.4 pg (ref 26.0–34.0)
MCHC: 33.4 g/dL (ref 30.0–36.0)
MCV: 88 fL (ref 78.0–100.0)
Platelets: 249 10*3/uL (ref 150–400)
RBC: 5.07 MIL/uL (ref 4.22–5.81)
RDW: 13.6 % (ref 11.5–15.5)
WBC: 5.4 10*3/uL (ref 4.0–10.5)

## 2015-10-08 LAB — APTT: aPTT: 29 s (ref 24–37)

## 2015-10-08 LAB — PROTIME-INR
INR: 1.09 (ref 0.00–1.49)
Prothrombin Time: 14.3 seconds (ref 11.6–15.2)

## 2015-10-08 NOTE — Pre-Procedure Instructions (Addendum)
EKG/CXR 3'16 Epic. Hgb A1C done 09-11-15 Epic.

## 2015-10-09 LAB — URINE CULTURE: CULTURE: NO GROWTH

## 2015-10-15 MED ORDER — DEXTROSE 5 % IV SOLN
3.0000 g | INTRAVENOUS | Status: AC
  Administered 2015-10-16: 3 g via INTRAVENOUS
  Filled 2015-10-15: qty 3000

## 2015-10-16 ENCOUNTER — Encounter (HOSPITAL_COMMUNITY)
Admission: RE | Disposition: A | Discharge status: Home / Self Care | Payer: Self-pay | Source: Ambulatory Visit | Attending: Urology

## 2015-10-16 ENCOUNTER — Ambulatory Visit (HOSPITAL_COMMUNITY): Payer: 59 | Admitting: Certified Registered Nurse Anesthetist

## 2015-10-16 ENCOUNTER — Ambulatory Visit (HOSPITAL_COMMUNITY)
Admission: RE | Admit: 2015-10-16 | Discharge: 2015-10-16 | Disposition: A | Discharge status: Home / Self Care | Payer: 59 | Source: Ambulatory Visit | Attending: Urology | Admitting: Urology

## 2015-10-16 ENCOUNTER — Encounter (HOSPITAL_COMMUNITY): Payer: Self-pay | Admitting: *Deleted

## 2015-10-16 DIAGNOSIS — Z7984 Long term (current) use of oral hypoglycemic drugs: Secondary | ICD-10-CM | POA: Insufficient documentation

## 2015-10-16 DIAGNOSIS — Z7982 Long term (current) use of aspirin: Secondary | ICD-10-CM | POA: Diagnosis not present

## 2015-10-16 DIAGNOSIS — Z6841 Body Mass Index (BMI) 40.0 and over, adult: Secondary | ICD-10-CM | POA: Diagnosis not present

## 2015-10-16 DIAGNOSIS — Z79899 Other long term (current) drug therapy: Secondary | ICD-10-CM | POA: Insufficient documentation

## 2015-10-16 DIAGNOSIS — I10 Essential (primary) hypertension: Secondary | ICD-10-CM | POA: Diagnosis not present

## 2015-10-16 DIAGNOSIS — Z87891 Personal history of nicotine dependence: Secondary | ICD-10-CM | POA: Diagnosis not present

## 2015-10-16 DIAGNOSIS — J45909 Unspecified asthma, uncomplicated: Secondary | ICD-10-CM | POA: Diagnosis not present

## 2015-10-16 DIAGNOSIS — E119 Type 2 diabetes mellitus without complications: Secondary | ICD-10-CM | POA: Diagnosis not present

## 2015-10-16 DIAGNOSIS — Z79891 Long term (current) use of opiate analgesic: Secondary | ICD-10-CM | POA: Insufficient documentation

## 2015-10-16 DIAGNOSIS — C6291 Malignant neoplasm of right testis, unspecified whether descended or undescended: Secondary | ICD-10-CM | POA: Diagnosis not present

## 2015-10-16 DIAGNOSIS — D176 Benign lipomatous neoplasm of spermatic cord: Secondary | ICD-10-CM | POA: Insufficient documentation

## 2015-10-16 DIAGNOSIS — N50811 Right testicular pain: Secondary | ICD-10-CM | POA: Diagnosis present

## 2015-10-16 HISTORY — PX: ORCHIECTOMY: SHX2116

## 2015-10-16 LAB — GLUCOSE, CAPILLARY
GLUCOSE-CAPILLARY: 151 mg/dL — AB (ref 65–99)
GLUCOSE-CAPILLARY: 155 mg/dL — AB (ref 65–99)

## 2015-10-16 SURGERY — ORCHIECTOMY
Anesthesia: General | Laterality: Right

## 2015-10-16 MED ORDER — LEVALBUTEROL HCL 1.25 MG/0.5ML IN NEBU
1.2500 mg | INHALATION_SOLUTION | Freq: Three times a day (TID) | RESPIRATORY_TRACT | Status: DC
  Administered 2015-10-16: 1.25 mg via RESPIRATORY_TRACT

## 2015-10-16 MED ORDER — LACTATED RINGERS IV SOLN
INTRAVENOUS | Status: DC | PRN
  Administered 2015-10-16 (×2): via INTRAVENOUS

## 2015-10-16 MED ORDER — LIDOCAINE HCL (CARDIAC) 20 MG/ML IV SOLN
INTRAVENOUS | Status: AC
  Filled 2015-10-16: qty 5

## 2015-10-16 MED ORDER — FENTANYL CITRATE (PF) 100 MCG/2ML IJ SOLN
25.0000 ug | INTRAMUSCULAR | Status: DC | PRN
  Administered 2015-10-16 (×3): 50 ug via INTRAVENOUS

## 2015-10-16 MED ORDER — ROCURONIUM BROMIDE 100 MG/10ML IV SOLN
INTRAVENOUS | Status: AC
  Filled 2015-10-16: qty 1

## 2015-10-16 MED ORDER — BUPIVACAINE HCL (PF) 0.25 % IJ SOLN
INTRAMUSCULAR | Status: AC
  Filled 2015-10-16: qty 30

## 2015-10-16 MED ORDER — OXYCODONE HCL 5 MG PO TABS: 5.0000 mg | ORAL_TABLET | ORAL | Status: DC | PRN

## 2015-10-16 MED ORDER — ONDANSETRON HCL 4 MG/2ML IJ SOLN
INTRAMUSCULAR | Status: DC | PRN
  Administered 2015-10-16: 4 mg via INTRAVENOUS

## 2015-10-16 MED ORDER — FENTANYL CITRATE (PF) 100 MCG/2ML IJ SOLN
INTRAMUSCULAR | Status: AC
  Filled 2015-10-16: qty 2

## 2015-10-16 MED ORDER — OXYCODONE HCL 5 MG PO TABS
5.0000 mg | ORAL_TABLET | ORAL | Status: DC | PRN
  Administered 2015-10-16: 5 mg via ORAL
  Filled 2015-10-16: qty 1

## 2015-10-16 MED ORDER — METOCLOPRAMIDE HCL 5 MG/ML IJ SOLN
INTRAMUSCULAR | Status: DC | PRN
  Administered 2015-10-16: 10 mg via INTRAVENOUS

## 2015-10-16 MED ORDER — PROPOFOL 10 MG/ML IV BOLUS
INTRAVENOUS | Status: AC
  Filled 2015-10-16: qty 20

## 2015-10-16 MED ORDER — FENTANYL CITRATE (PF) 250 MCG/5ML IJ SOLN
INTRAMUSCULAR | Status: AC
  Filled 2015-10-16: qty 5

## 2015-10-16 MED ORDER — BUPIVACAINE HCL (PF) 0.25 % IJ SOLN
INTRAMUSCULAR | Status: DC | PRN
  Administered 2015-10-16: 9 mL

## 2015-10-16 MED ORDER — MIDAZOLAM HCL 2 MG/2ML IJ SOLN
INTRAMUSCULAR | Status: DC | PRN
  Administered 2015-10-16: 2 mg via INTRAVENOUS

## 2015-10-16 MED ORDER — FENTANYL CITRATE (PF) 100 MCG/2ML IJ SOLN
INTRAMUSCULAR | Status: DC | PRN
  Administered 2015-10-16: 100 ug via INTRAVENOUS
  Administered 2015-10-16: 50 ug via INTRAVENOUS
  Administered 2015-10-16 (×2): 100 ug via INTRAVENOUS

## 2015-10-16 MED ORDER — PROPOFOL 10 MG/ML IV BOLUS
INTRAVENOUS | Status: DC | PRN
  Administered 2015-10-16: 250 mg via INTRAVENOUS
  Administered 2015-10-16 (×3): 50 mg via INTRAVENOUS

## 2015-10-16 MED ORDER — METOCLOPRAMIDE HCL 5 MG/ML IJ SOLN
INTRAMUSCULAR | Status: AC
  Filled 2015-10-16: qty 2

## 2015-10-16 MED ORDER — CEPHALEXIN 500 MG PO CAPS: 500.0000 mg | ORAL_CAPSULE | Freq: Three times a day (TID) | ORAL | Status: DC

## 2015-10-16 MED ORDER — LEVALBUTEROL HCL 1.25 MG/0.5ML IN NEBU
INHALATION_SOLUTION | RESPIRATORY_TRACT | Status: AC
  Filled 2015-10-16: qty 0.5

## 2015-10-16 MED ORDER — MIDAZOLAM HCL 2 MG/2ML IJ SOLN
INTRAMUSCULAR | Status: AC
  Filled 2015-10-16: qty 2

## 2015-10-16 MED ORDER — ONDANSETRON HCL 4 MG/2ML IJ SOLN
INTRAMUSCULAR | Status: AC
  Filled 2015-10-16: qty 2

## 2015-10-16 MED ORDER — PROMETHAZINE HCL 25 MG/ML IJ SOLN: 6.2500 mg | INTRAMUSCULAR | Status: DC | PRN

## 2015-10-16 MED ORDER — LIDOCAINE HCL (CARDIAC) 20 MG/ML IV SOLN
INTRAVENOUS | Status: DC | PRN
  Administered 2015-10-16: 80 mg via INTRAVENOUS

## 2015-10-16 MED ORDER — SUCCINYLCHOLINE CHLORIDE 20 MG/ML IJ SOLN
INTRAMUSCULAR | Status: DC | PRN
  Administered 2015-10-16: 40 mg via INTRAVENOUS
  Administered 2015-10-16: 140 mg via INTRAVENOUS

## 2015-10-16 MED FILL — oxyCODONE HCL 5 MG TABS: 5 | 6 days supply | Qty: 40 | Fill #0

## 2015-10-16 MED FILL — CEPHALEXIN 500 MG CAPSULE: 500 | 5 days supply | Qty: 15 | Fill #0

## 2015-10-16 SURGICAL SUPPLY — 24 items
BLADE HEX COATED 2.75 (ELECTRODE) ×2 IMPLANT
BNDG GAUZE ELAST 4 BULKY (GAUZE/BANDAGES/DRESSINGS) ×2 IMPLANT
COVER SURGICAL LIGHT HANDLE (MISCELLANEOUS) IMPLANT
ELECT REM PT RETURN 9FT ADLT (ELECTROSURGICAL) ×2
ELECTRODE REM PT RTRN 9FT ADLT (ELECTROSURGICAL) ×1 IMPLANT
GLOVE BIOGEL M STRL SZ7.5 (GLOVE) ×10 IMPLANT
GOWN STRL REUS W/TWL LRG LVL3 (GOWN DISPOSABLE) ×6 IMPLANT
KIT BASIN OR (CUSTOM PROCEDURE TRAY) ×2 IMPLANT
LIQUID BAND (GAUZE/BANDAGES/DRESSINGS) ×2 IMPLANT
NEEDLE HYPO 22GX1.5 SAFETY (NEEDLE) IMPLANT
NS IRRIG 1000ML POUR BTL (IV SOLUTION) ×2 IMPLANT
PACK GENERAL/GYN (CUSTOM PROCEDURE TRAY) ×2 IMPLANT
SUPPORT SCROTAL LG STRP (MISCELLANEOUS) ×2 IMPLANT
SUT CHROMIC 3 0 SH 27 (SUTURE) ×4 IMPLANT
SUT SILK 0 SH 30 (SUTURE) ×2 IMPLANT
SUT SILK 2 0 SH (SUTURE) ×4 IMPLANT
SUT VIC AB 2-0 UR5 27 (SUTURE) IMPLANT
SUT VIC AB 3-0 SH 27 (SUTURE) ×1
SUT VIC AB 3-0 SH 27XBRD (SUTURE) ×1 IMPLANT
SUT VIC AB 4-0 PS2 27 (SUTURE) ×2 IMPLANT
SUT VICRYL 0 TIES 12 18 (SUTURE) ×2 IMPLANT
SYR CONTROL 10ML LL (SYRINGE) IMPLANT
TOWEL OR 17X26 10 PK STRL BLUE (TOWEL DISPOSABLE) ×2 IMPLANT
WATER STERILE IRR 1500ML POUR (IV SOLUTION) IMPLANT

## 2015-10-16 NOTE — Op Note (Signed)
Date of procedure: 10/16/2015  Preoperative diagnosis:  1. Right testicular lesion 2. Right testicular pain   Postoperative diagnosis:  1. Right testicular lesion 2. Right testicular pain  Procedure: 1. Right radical orchiectomy  Surgeon: Baruch Gouty, MD  Anesthesia: General  Complications: None  Intraoperative findings: Normal-appearing right testicle  EBL: Minimal  Specimens: Right testicle  Drains: None  Disposition: Stable to the postanesthesia care unit  Indication for procedure: The patient is a 45 y.o. male with right testicular pain as well as areas within his testicle that are concerning for malignancy. His tumor markers were normal.  After reviewing the management options for treatment, the patient elected to proceed with the above surgical procedure(s). We have discussed the potential benefits and risks of the procedure, side effects of the proposed treatment, the likelihood of the patient achieving the goals of the procedure, and any potential problems that might occur during the procedure or recuperation. Informed consent has been obtained.  Description of procedure: The patient was met in the preoperative area. All risks, benefits, and indications of the procedure were described in great detail. The patient consented to the procedure. Preoperative antibiotics were given. The patient was taken to the operative theater. General anesthesia was induced per the anesthesia service. The patient was placed in the supine position. He was prepped and draped in usual sterile fashion. A timeout was called. A 4 cm incision was made over the right inguinal canal. Using electrocautery, dissection then took placed on the level of the spermatic cord. Once the spermatic cord was visualized, the testicle was delivered through the incision from the scrotum. The gubernaculum was incised with cautery. The spermatic cord was clamped proximally and distally. The proximal clamp was no level of  the inguinal ring. Spermatic cord was cut between the clamps and testicle sent to pathology. A 2-0 silk stick tie was then placed proximal to the clamp. A free 2-0 silk was then placed proximal to the stick tie. Proximal to this and 0 silk suture was placed. Released the clamp showed excellent hemostasis. The 0 silk suture length was left long in the event the patient needs a RPLND in the future. Hemostasis at this point is excellent. Subcutaneous tissues were reapproximated with a running 3-0 Vicryl. Subcutaneous." The 4-0 running Vicryl. Dermabond was placed on the incision. 10 cc of bupivacaine 0.25% was placed in the incision. The patient was woke from anesthesia and transferred in stable condition to the post anesthesia care unit without any apparent complications.  Plan: The patient will follow-up in one to 2 weeks to discuss pathology results.  Baruch Gouty, M.D.

## 2015-10-16 NOTE — H&P (Addendum)
Chief Complaint  Right Scrotal Pain   Referring provider: Garret Reddish   History of Present Illness  The patient is a 45 year old gentleman who presents for evaluation of his right testicular tenderness. He says it started a number of years ago. He notes the pain is intermittent. He had a workup for it in 2012 with an ultrasound and was told there was inflammation and told to take Aleve. This did not help. He continues of this pain. He denies any other urinary symptoms. He is no history of UTI. He has no history of known trauma to his testicles. He notes no recent injuries. Is not red infection his prostate as far as he knows.     December 2016 Interval history:  The patient still continues to have intermittent right testicular pain and sensitivity. He says that in better for some time, but today he is expensive for her. His scrotal ultrasound shows multiple hypoechoic areas that are diffuse with irregular borders.    January 2017 Interval History:   The patient returns today for a scrotal ultrasound. It is unchanged from his previous study. The radiologist's was the differential diagnoses include neoplasm, infection, infarction. He still had intermittent right testicular pain. Cystoscope was very tender to palpation. Again this is been going on for many years. He denies any changes to his urinary or gastrointestinal system.   Past Medical History Problems  1. History of asthma (Z87.09) 2. History of diabetes mellitus (Z86.39) 3. History of hypertension (Z86.79)  Surgical History Problems  1. History of Appendectomy 2. History of Ear Surgery 3. History of Leg Repair  Current Meds 1. Aspirin 81 MG TABS;  Therapy: (Recorded:01Nov2016) to Recorded 2. Glimepiride TABS;  Therapy: (Recorded:01Nov2016) to Recorded 3. Losartan Potassium TABS;  Therapy: (Recorded:01Nov2016) to Recorded 4. MetFORMIN HCl - 1000 MG Oral Tablet;  Therapy: (Recorded:01Nov2016) to Recorded 5. OxyCODONE  HCl TABS;  Therapy: (Recorded:01Nov2016) to Recorded 6. Singulair TABS;  Therapy: (Recorded:01Nov2016) to Recorded  Allergies Medication  1. No Known Drug Allergies  Family History Problems  1. Family history of Alzheimer's disease (Z82.0) : Father 2. Family history of deep venous thrombosis (Z82.49) : Mother 3. Family history of kidney stones (Z84.1) : Father 4. Family history of myocardial infarction (Z82.49) : Sister  Social History Problems  1. Denied: History of Alcohol use 2. Caffeine use (F15.90) 3. Former smoker (Z87.891)   1ppd, 10 year, quit 2011 4. Married 5. Occupation   Chief Financial Officer  Vitals Vital Signs [Data Includes: Last 1 Day]  Recorded: [id number] 02:59PM  Blood Pressure: 151 / 82 Temperature: 98.5 F Heart Rate: 94  Physical Exam Constitutional: Well nourished . No acute distress.  ENT:. The ears and nose are normal in appearance.  Neck: The appearance of the neck is normal.  Cardiovascular:. No peripheral edema.  Abdomen: The abdomen is soft and nontender.  Genitourinary: Right testicle extremely tender to palpation. unable to examine.    Results/Data Urine [Data Includes: Last 1 Day]   [id number]  COLOR YELLOW   APPEARANCE CLEAR   SPECIFIC GRAVITY 1.020   pH 5.5   GLUCOSE 3+   BILIRUBIN NEGATIVE   KETONE NEGATIVE   BLOOD TRACE   PROTEIN NEGATIVE   NITRITE NEGATIVE   LEUKOCYTE ESTERASE NEGATIVE   SQUAMOUS EPITHELIAL/HPF 0-5 HPF  WBC NONE SEEN WBC/HPF  RBC 0-2 RBC/HPF  BACTERIA NONE SEEN HPF  CRYSTALS NONE SEEN HPF  CASTS NONE SEEN LPF  Yeast NONE SEEN HPF      Scrotal ultrasound impression as  read by radiologist:  Multiple hypoechoic lesions are again identified within the right testicle is similar to that seen on the prior exam. They are relatively hypovascular in nature. Differential includes neoplasm, infection and infarction. Infection is felt to most likely due to the lack of hyperlipidemia.    Stable right epididymal cyst  right hydrocele.    Stable microcalcifications both testicles.   Assessment Assessed  1. Right testicular pain (N50.811) 2. Testicular lesion (N50.9)    Plan Health Maintenance  1. UA With REFLEX; [Do Not Release]; Status:Resulted - Requires Verification;   [id number] 02:49PM Testicular lesion  2. Follow-up Office  patient will call to schedule surgery or follow up  Status: Hold For -  Appointment,Date of Service  Requested for: [id number]    1. Right testicular pain/hypoechoic areas in right testicle  -right radical orchiectomy

## 2015-10-16 NOTE — Anesthesia Preprocedure Evaluation (Addendum)
Anesthesia Evaluation  Patient identified by MRN, date of birth, ID band Patient awake    Reviewed: Allergy & Precautions, NPO status , Patient's Chart, lab work & pertinent test results  History of Anesthesia Complications (+) PONV and history of anesthetic complications  Airway Mallampati: II  TM Distance: >3 FB Neck ROM: Full    Dental no notable dental hx.    Pulmonary asthma ,    Pulmonary exam normal breath sounds clear to auscultation       Cardiovascular hypertension, Pt. on medications Normal cardiovascular exam Rhythm:Regular Rate:Normal     Neuro/Psych negative neurological ROS  negative psych ROS   GI/Hepatic negative GI ROS, Neg liver ROS,   Endo/Other  diabetes, Type 2, Oral Hypoglycemic AgentsMorbid obesity  Renal/GU negative Renal ROS  negative genitourinary   Musculoskeletal negative musculoskeletal ROS (+)   Abdominal   Peds negative pediatric ROS (+)  Hematology negative hematology ROS (+)   Anesthesia Other Findings   Reproductive/Obstetrics negative OB ROS                             Anesthesia Physical Anesthesia Plan  ASA: III  Anesthesia Plan: General   Post-op Pain Management:    Induction: Intravenous  Airway Management Planned: Oral ETT  Additional Equipment:   Intra-op Plan:   Post-operative Plan: Extubation in OR  Informed Consent: I have reviewed the patients History and Physical, chart, labs and discussed the procedure including the risks, benefits and alternatives for the proposed anesthesia with the patient or authorized representative who has indicated his/her understanding and acceptance.   Dental advisory given  Plan Discussed with: CRNA  Anesthesia Plan Comments:         Anesthesia Quick Evaluation

## 2015-10-16 NOTE — Progress Notes (Signed)
Xopenex breathing treatment  Given for expiratory wheezing

## 2015-10-16 NOTE — Anesthesia Postprocedure Evaluation (Signed)
Anesthesia Post Note  Patient: Henry Klein  Procedure(s) Performed: Procedure(s) (LRB): RADICAL ORCHIECTOMY (Right)  Patient location during evaluation: PACU Anesthesia Type: General Level of consciousness: awake and alert Pain management: pain level controlled Vital Signs Assessment: post-procedure vital signs reviewed and stable Respiratory status: spontaneous breathing, nonlabored ventilation, respiratory function stable and patient connected to nasal cannula oxygen Cardiovascular status: blood pressure returned to baseline and stable Postop Assessment: no signs of nausea or vomiting Anesthetic complications: no    Last Vitals:  Filed Vitals:   10/16/15 0953 10/16/15 1120  BP: 152/75 123/63  Pulse: 83 88  Temp: 37 C   Resp: 16 16    Last Pain:  Filed Vitals:   10/16/15 1130  PainSc: 3                  Geremiah Fussell J

## 2015-10-16 NOTE — Progress Notes (Signed)
Up to void w asst. Scrotal support was way too small. Removed and net underwear w guaze fluff applied

## 2015-10-16 NOTE — Progress Notes (Signed)
Patient to stay in PACU at least one hour and at least one hour in Short Stay- per Dr. Delma Post.

## 2015-10-16 NOTE — Progress Notes (Signed)
Expiratory wheezing  No longer heard- chest sounds clear

## 2015-10-16 NOTE — Anesthesia Procedure Notes (Signed)
Procedure Name: Intubation Date/Time: 10/16/2015 7:50 AM Performed by: Raenette Rover Pre-anesthesia Checklist: Patient identified, Emergency Drugs available, Suction available and Patient being monitored Patient Re-evaluated:Patient Re-evaluated prior to inductionOxygen Delivery Method: Circle system utilized Preoxygenation: Pre-oxygenation with 100% oxygen Intubation Type: IV induction Ventilation: Mask ventilation without difficulty and Oral airway inserted - appropriate to patient size Laryngoscope Size: Glidescope and 4 Grade View: Grade I Tube type: Oral Tube size: 7.5 mm Number of attempts: 2 Airway Equipment and Method: Stylet Placement Confirmation: ETT inserted through vocal cords under direct vision,  positive ETCO2,  CO2 detector and breath sounds checked- equal and bilateral Secured at: 23 cm Tube secured with: Tape Dental Injury: Teeth and Oropharynx as per pre-operative assessment  Difficulty Due To: Difficulty was anticipated Comments: DLx1 with Miller 3 by CRNA--Grade 2 view; ETT passed anterior to arytenoids but ETCO2 not visualized.  Mask ventilation achieved with OAW in place.  DL x1 with Glidescope LoPro4--Grade 1 view.  ETT passed through anterior vocal cords.  ETCO2 not visualized for the first several breaths, but ETCO2 and chest rise present following the first few attempted breaths.

## 2015-10-16 NOTE — Discharge Instructions (Signed)
General Anesthesia, Adult, Care After Refer to this sheet in the next few weeks. These instructions provide you with information on caring for yourself after your procedure. Your health care provider may also give you more specific instructions. Your treatment has been planned according to current medical practices, but problems sometimes occur. Call your health care provider if you have any problems or questions after your procedure. WHAT TO EXPECT AFTER THE PROCEDURE After the procedure, it is typical to experience:  Sleepiness.  Nausea and vomiting. HOME CARE INSTRUCTIONS  For the first 24 hours after general anesthesia:  Have a responsible person with you.  Do not drive a car. If you are alone, do not take public transportation.  Do not drink alcohol.  Do not take medicine that has not been prescribed by your health care provider.  Do not sign important papers or make important decisions.  You may resume a normal diet and activities as directed by your health care provider.  If you have questions or problems that seem related to general anesthesia, call the hospital and ask for the anesthetist or anesthesiologist on call. SEEK MEDICAL CARE IF:  You have nausea and vomiting that continue the day after anesthesia.  You develop a rash. SEEK IMMEDIATE MEDICAL CARE IF:   You have difficulty breathing.  You have chest pain.  You have any allergic problems.   This information is not intended to replace advice given to you by your health care provider. Make sure you discuss any questions you have with your health care provider.   Document Released: 11/28/2000 Document Revised: 09/12/2014 Document Reviewed: 12/21/2011 Elsevier Interactive Patient Education [employer].

## 2015-10-16 NOTE — Transfer of Care (Signed)
Immediate Anesthesia Transfer of Care Note  Patient: Henry Klein  Procedure(s) Performed: Procedure(s): RADICAL ORCHIECTOMY (Right)  Patient Location: PACU  Anesthesia Type:General  Level of Consciousness: awake, alert , oriented and patient cooperative  Airway & Oxygen Therapy: Patient Spontanous Breathing and Patient connected to nasal cannula oxygen  Post-op Assessment: Report given to RN and Post -op Vital signs reviewed and stable  Post vital signs: Reviewed and stable  Last Vitals:  Filed Vitals:   10/16/15 0545  BP: 120/73  Pulse: 77  Temp: 36.5 C  Resp: 18    Complications: No apparent anesthesia complications

## 2015-10-28 ENCOUNTER — Other Ambulatory Visit: Payer: Self-pay

## 2015-10-28 ENCOUNTER — Telehealth: Payer: Self-pay | Admitting: Family Medicine

## 2015-10-28 MED ORDER — CANAGLIFLOZIN 100 MG PO TABS
100.0000 mg | ORAL_TABLET | Freq: Every day | ORAL | Status: DC
Start: 2015-10-28 — End: 2015-11-03

## 2015-10-28 NOTE — Telephone Encounter (Signed)
See below

## 2015-10-28 NOTE — Telephone Encounter (Signed)
Prior authorization for dapagliflozin propanediol (FARXIGA) 5 MG TABS tablet has been denied by [employer] stating:  Henry Klein can be approved if:  Your patient has a history of a three month trial resulting in therapeutic failure, contraindication, or intolerance to both of the following:  (a) Invokana (b) Jardiance

## 2015-10-28 NOTE — Telephone Encounter (Signed)
Keba please send in invokana 100 mg once daily prior to first meal of the day #30 with 5 refills. I think that we have some coupons he can pick up or sometimes they are available on company website.

## 2015-10-28 NOTE — Telephone Encounter (Addendum)
Pt states the [place] we referred to told pt to sates it may be another month before he gets in. Pt states he has found a clinic in [facility] that will accept his insurance and is taking new pts Pt states he needs referral to  [facility] [address] [phone]  Per pt,This doctor is a neurologist that specializes in pain management.

## 2015-10-28 NOTE — Telephone Encounter (Signed)
Medication sent in. 

## 2015-10-29 ENCOUNTER — Other Ambulatory Visit: Payer: Self-pay | Admitting: Urology

## 2015-10-29 ENCOUNTER — Ambulatory Visit (HOSPITAL_COMMUNITY)
Admission: RE | Admit: 2015-10-29 | Discharge: 2015-10-29 | Disposition: A | Discharge status: Home / Self Care | Payer: 59 | Source: Ambulatory Visit | Attending: Urology | Admitting: Urology

## 2015-10-29 ENCOUNTER — Other Ambulatory Visit: Payer: Self-pay | Admitting: Family Medicine

## 2015-10-29 DIAGNOSIS — C6290 Malignant neoplasm of unspecified testis, unspecified whether descended or undescended: Secondary | ICD-10-CM | POA: Insufficient documentation

## 2015-10-29 DIAGNOSIS — N509 Disorder of male genital organs, unspecified: Secondary | ICD-10-CM | POA: Insufficient documentation

## 2015-10-29 DIAGNOSIS — M549 Dorsalgia, unspecified: Principal | ICD-10-CM

## 2015-10-29 DIAGNOSIS — G8929 Other chronic pain: Secondary | ICD-10-CM

## 2015-10-29 NOTE — Telephone Encounter (Signed)
Referral placed.

## 2015-11-02 ENCOUNTER — Telehealth: Payer: Self-pay | Admitting: Family Medicine

## 2015-11-02 NOTE — Telephone Encounter (Signed)
Pt call to say he had not taken the following med for 5 days and it had no effect in his sugar levels.  He is asking if he should stop taking the med or cut the dose  Sugar level everyday was about 105-125 in the afternoons     160 in the mornings  metFORMIN (GLUCOPHAGE) 1000 MG tablet

## 2015-11-02 NOTE — Telephone Encounter (Signed)
Glad sugars are better- I would continue the metformin and follow up as previously discussed. His a1c was far too high and he needs every bit of help he can get since he wants to avoid insulin

## 2015-11-02 NOTE — Telephone Encounter (Signed)
See below

## 2015-11-02 NOTE — Telephone Encounter (Signed)
Pt called and notified

## 2015-11-03 ENCOUNTER — Ambulatory Visit (HOSPITAL_BASED_OUTPATIENT_CLINIC_OR_DEPARTMENT_OTHER): Payer: 59 | Admitting: Oncology

## 2015-11-03 VITALS — BP 166/81 | HR 76 | Temp 98.3°F | Resp 18 | Ht 68.0 in | Wt 325.3 lb

## 2015-11-03 DIAGNOSIS — E119 Type 2 diabetes mellitus without complications: Secondary | ICD-10-CM | POA: Diagnosis not present

## 2015-11-03 DIAGNOSIS — C6291 Malignant neoplasm of right testis, unspecified whether descended or undescended: Secondary | ICD-10-CM | POA: Diagnosis not present

## 2015-11-03 DIAGNOSIS — E669 Obesity, unspecified: Secondary | ICD-10-CM | POA: Diagnosis not present

## 2015-11-03 DIAGNOSIS — I10 Essential (primary) hypertension: Secondary | ICD-10-CM | POA: Diagnosis not present

## 2015-11-03 DIAGNOSIS — Z8505 Personal history of malignant neoplasm of liver: Secondary | ICD-10-CM

## 2015-11-03 NOTE — Consult Note (Signed)
Reason for Referral: Testicular cancer.   HPI: This is a pleasant 45 year old gentleman native of [place] currently lives in [place] and have done so for the last 2 years. Gentleman with history of hypertension, diabetes and obesity. He has complained of right testicular pain for the last 5 years or so and subsequently was evaluated by Dr. Pilar Jarvis from urology and an ultrasound showed multiple hypoechoic areas with diffuse irregular borders. After a period of surveillance,  he underwent a right radical orchiectomy on 10/16/2015. 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 The margins were negative, without spermatic cord or surgical margins involvement. No lymphovascular invasion and the final pathological staging was T1 NX. CT scan of the abdomen and pelvis as well as a chest x-ray obtained on 10/29/2015 all showed no evidence of disease. Patient to have recovered well from his operation and have reported that he is healing well and have return to work. He does report some occasional inguinal pain but no other complaints.  He does not report any headaches, blurry vision, syncope or seizures. He does not report any fevers, chills or sweats. Does not report any cough, wheezing or hemoptysis. Does not report any nausea, vomiting or abdominal pain. He does not report any constipation, diarrhea or hematochezia. He does not report any frequency urgency or hesitancy. Has not reported any skeletal complaints. Remaining review of systems unremarkable.   Past Medical History  Diagnosis Date  . Asthma   . Hypertension     hctz 12.5mg   . Diabetes mellitus without complication ([place])   . Chronic back pain     history herniated disc- back pain over 5-6 yrs-oral pain meds used over long period of time.  . Chicken pox   . Complication of anesthesia   . PONV (postoperative nausea and vomiting)   . Pain     pain in scrotal area and very  sensitive-denies swelling.  :  Past Surgical History  Procedure Laterality Date  . Knee surgery  1985    broken femur- age 61  . Appendectomy  1980  . Myringotomy with tube placement      '83  . Orchiectomy Right 10/16/2015    Procedure: RADICAL ORCHIECTOMY;  Surgeon: Nickie Retort, MD;  Location: [facility];  Service: Urology;  Laterality: Right;  :   Current outpatient prescriptions:  .  aspirin EC 81 MG tablet, Take 81 mg by mouth daily., Disp: , Rfl:  .  glimepiride (AMARYL) 4 MG tablet, Take 1 tablet (4 mg total) by mouth every morning., Disp: 30 tablet, Rfl: 5 .  hydrochlorothiazide (MICROZIDE) 12.5 MG capsule, Take 1 capsule (12.5 mg total) by mouth daily., Disp: 30 capsule, Rfl: 5 .  losartan (COZAAR) 50 MG tablet, Take 1 tablet (50 mg total) by mouth 2 (two) times daily., Disp: 60 tablet, Rfl: 5 .  metFORMIN (GLUCOPHAGE) 1000 MG tablet, Take 1 tablet (1,000 mg total) by mouth 2 (two) times daily., Disp: 60 tablet, Rfl: 5 .  montelukast (SINGULAIR) 10 MG tablet, Take 1 tablet (10 mg total) by mouth at bedtime., Disp: 30 tablet, Rfl: 5 .  oxyCODONE (OXY IR/ROXICODONE) 5 MG immediate release tablet, Take 1 tablet (5 mg total) by mouth every 4 (four) hours as needed for severe pain., Disp: 40 tablet, Rfl: 0 .  Oxycodone HCl 10 MG TABS, As directed, Disp: , Rfl: :  No Known Allergies:  Family History  Problem Relation Age of Onset  .  Hypertension Father   . Diabetes Father   . Diabetes Maternal Grandmother     type I  . Heart disease Paternal Grandfather     9s  . Liver cancer Paternal Grandfather     former drinker  . Heart disease Sister     MI? no autopsy before 78  :  Social History   Social History  . Marital Status: Married    Spouse Name: N/A  . Number of Children: N/A  . Years of Education: N/A   Occupational History  . Not on file.   Social History Main Topics  . Smoking status: Never Smoker   . Smokeless tobacco: Current User    Types: Chew  .  Alcohol Use: No     Comment: rare social  . Drug Use: No  . Sexual Activity: Yes   Other Topics Concern  . Not on file   Social History Narrative   Married. No children. 1 cat.       Works as Chief Financial Officer for [employer]. At [employer]: golfing  :  Pertinent items are noted in HPI.  Exam: ECOG 0 Blood pressure 166/81, pulse 76, temperature 98.3 F (36.8 C), temperature source Oral, resp. rate 18, height 5\' 8"  (1.727 m), weight 325 lb 4.8 oz (147.555 kg), SpO2 99 %. General appearance: alert and cooperative Head: Normocephalic, without obvious abnormality Throat: lips, mucosa, and tongue normal; teeth and gums normal Neck: no adenopathy Back: negative Resp: clear to auscultation bilaterally Chest wall: no tenderness Cardio: regular rate and rhythm, S1, S2 normal, no murmur, click, rub or gallop GI: soft, non-tender; bowel sounds normal; no masses,  no organomegaly Extremities: extremities normal, atraumatic, no cyanosis or edema Pulses: 2+ and symmetric Skin: Skin color, texture, turgor normal. No rashes or lesions Lymph nodes: Cervical, supraclavicular, and axillary nodes normal.  Tumor markers including LDH, alpha-fetoprotein and beta-hCG all within normal range obtain of February 2017.   Dg Chest 2 View  10/29/2015  CLINICAL DATA:  History of testicular carcinoma, asthma, diabetes EXAM: CHEST  2 VIEW COMPARISON:  Chest x-ray of 11/12/2014 FINDINGS: No active infiltrate or effusion is seen. There is no evidence of metastatic involvement of the lungs. Mediastinal and hilar contours are unremarkable. The heart is within normal limits in size. No bony abnormality is seen. IMPRESSION: No active cardiopulmonary disease. Electronically Signed   By: Ivar Drape M.D.   On: 10/29/2015 17:17    Assessment and Plan:   45 year old gentleman with the following issues:  1. 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 He had a long-standing history of  testicular pain and subsequently underwent a right orchiectomy on 10/16/2015. The tumor was measuring 0.9 cm and pathologically at T1. CT scan and chest x-ray showed no evidence of disease indicating very early stage I disease.  The natural course of this disease was discussed with the patient extensively. At this time, his chance of cure is exceeding 85% close to 90% at this time. Any additional therapy would likely will be an overt treatments. However, he does require constant surveillance for the next 5 years given the risk of possible relapse from his seminoma.  The role of adjuvant chemotherapy and radiation therapy were discussed but I feel certainly it would be over treatment given his early stage disease.  Active surveillance will be the way to go and I have recommended that for him. The schedule for surveillance varies but imaging studies  including the abdomen as well as imaging studies of the chest every 3-6 months for the first 2 years and likely every 6 months in year 2 and annually in your 3 and 4 would be reasonable. Variations to the schedule are reasonable as well.  Late recurrences beyond 5 years have been documented with seminoma and will likely need routine physical examination after 5 years.  All his questions were answered today to his satisfaction. He is already set up for imaging studies to be done with Dr. Pilar Jarvis at [facility] urology and I certainly concur with his assessment.  2. Age-appropriate cancer screening: He is up-to-date at this time and I continue to encourage him to get routine physical examination was primary care provider.  I will be happy to see him in the future for any questions or complaints. Certainly if he develops any relapse on imaging studies.

## 2015-11-03 NOTE — Progress Notes (Signed)
Please see consult note.  

## 2015-12-16 LAB — HM DIABETES EYE EXAM

## 2015-12-17 ENCOUNTER — Telehealth: Payer: Self-pay | Admitting: Family Medicine

## 2015-12-17 MED ORDER — GLIMEPIRIDE 4 MG PO TABS: 4.0000 mg | ORAL_TABLET | Freq: Every morning | ORAL | Status: DC

## 2015-12-17 MED ORDER — METFORMIN HCL 1000 MG PO TABS: 1000.0000 mg | ORAL_TABLET | Freq: Two times a day (BID) | ORAL | Status: DC

## 2015-12-17 MED ORDER — HYDROCHLOROTHIAZIDE 12.5 MG PO CAPS: 12.5000 mg | ORAL_CAPSULE | Freq: Every day | ORAL | Status: DC

## 2015-12-17 MED ORDER — LOSARTAN POTASSIUM 50 MG PO TABS: 50.0000 mg | ORAL_TABLET | Freq: Two times a day (BID) | ORAL | Status: DC

## 2015-12-17 NOTE — Telephone Encounter (Signed)
Medications sent in.

## 2015-12-17 NOTE — Telephone Encounter (Signed)
Pt need the following Rx glimepiride, hydrochlorothiazide, losartan 50 mg and metformin.  Pharm:  [facility] on [address]

## 2015-12-22 ENCOUNTER — Ambulatory Visit: Payer: 59 | Admitting: Family Medicine

## 2015-12-22 ENCOUNTER — Encounter: Payer: Self-pay | Admitting: Family Medicine

## 2015-12-28 ENCOUNTER — Telehealth: Payer: Self-pay | Admitting: Family Medicine

## 2015-12-28 NOTE — Telephone Encounter (Signed)
Pt has an appt on 4-28 and would like to have skin tag removal on that visit

## 2015-12-28 NOTE — Telephone Encounter (Signed)
Is this ok? This visit is a follow up visit.

## 2015-12-28 NOTE — Telephone Encounter (Signed)
Depends on # of acute concerns that day and stability of prior issues. We will have to see on day of if we an do it then or need to schedule procedure visit. Since he is last visit of day- higher likelihood that we can do it though

## 2016-01-01 ENCOUNTER — Encounter: Payer: Self-pay | Admitting: Family Medicine

## 2016-01-01 ENCOUNTER — Ambulatory Visit (INDEPENDENT_AMBULATORY_CARE_PROVIDER_SITE_OTHER): Payer: 59 | Admitting: Family Medicine

## 2016-01-01 VITALS — BP 130/72 | HR 81 | Temp 97.7°F | Wt 326.0 lb

## 2016-01-01 DIAGNOSIS — J4530 Mild persistent asthma, uncomplicated: Secondary | ICD-10-CM | POA: Diagnosis not present

## 2016-01-01 DIAGNOSIS — E119 Type 2 diabetes mellitus without complications: Secondary | ICD-10-CM | POA: Diagnosis not present

## 2016-01-01 DIAGNOSIS — I10 Essential (primary) hypertension: Secondary | ICD-10-CM | POA: Diagnosis not present

## 2016-01-01 DIAGNOSIS — D485 Neoplasm of uncertain behavior of skin: Secondary | ICD-10-CM | POA: Diagnosis not present

## 2016-01-01 LAB — POCT GLYCOSYLATED HEMOGLOBIN (HGB A1C): Hemoglobin A1C: 8.5

## 2016-01-01 MED ORDER — DULAGLUTIDE 1.5 MG/0.5ML ~~LOC~~ SOPN
PEN_INJECTOR | SUBCUTANEOUS | Status: AC
Start: 2016-01-01 — End: ?

## 2016-01-01 NOTE — Patient Instructions (Addendum)
Increase glimepiride to 8mg  (2 pills) Start trulicity weekly Lab Results  Component Value Date   HGBA1C 8.5 01/01/2016  Continue your diet changes.  Hopefully we can get you to goal with this without insulin  Please sign up for mychart. We will release your results on mychart within 1-2 weeks if pathology shows no cancer or if we got everything and it was cancer. We will call you if there are any abnormalities that need to be discussed further or if medication changes are needed. Please call us if you have not received results within 2 weeks or if you have questions about any of the results or comments.   Watch out for expanding redness, pain near the site or if the growth starts to grow again in future  Health Maintenance Due  Topic Date Due  . FOOT EXAM  03/15/1983  . HIV Screening  03/14/1988  . TETANUS/TDAP  03/14/1992  see me in 3 months and a day- we need to update the above

## 2016-01-01 NOTE — Progress Notes (Signed)
Subjective:  Henry Klein is a 45 y.o. year old very pleasant male patient who presents for/with See problem oriented charting ROS- No chest pain or shortness of breath. No headache or blurry vision. No hypoglycemia.   Past Medical History-  Patient Active Problem List   Diagnosis Date Noted  . Diabetes mellitus without complication ([place])     Priority: High  . Chronic back pain     Priority: High  . Asthma     Priority: Medium  . Hypertension     Priority: Medium  . Right testicular pain 05/12/2015    Medications- reviewed and updated Current Outpatient Prescriptions  Medication Sig Dispense Refill  . aspirin EC 81 MG tablet Take 81 mg by mouth daily.    Marland Kitchen glimepiride (AMARYL) 4 MG tablet Take 2 tablets (8 mg total) by mouth every morning. 60 tablet 5  . hydrochlorothiazide (MICROZIDE) 12.5 MG capsule Take 1 capsule (12.5 mg total) by mouth daily. 30 capsule 5  . losartan (COZAAR) 50 MG tablet Take 1 tablet (50 mg total) by mouth 2 (two) times daily. 60 tablet 5  . metFORMIN (GLUCOPHAGE) 1000 MG tablet Take 1 tablet (1,000 mg total) by mouth 2 (two) times daily. 60 tablet 5  . montelukast (SINGULAIR) 10 MG tablet Take 1 tablet (10 mg total) by mouth at bedtime. 30 tablet 5  . Dulaglutide (TRULICITY) 1.5 0000000 SOPN Inject 1.5 ml  weekly 3 pen 5  . Oxycodone HCl 10 MG TABS 3 times a day     No current facility-administered medications for this visit.    Objective: BP 130/72 mmHg  Pulse 81  Temp(Src) 97.7 F (36.5 C)  Wt 326 lb (147.873 kg) Gen: NAD, resting comfortably Left neck with 2 mm base lesion that has tag like appearance but tip also has horned appearance. CV: RRR no murmurs rubs or gallops Lungs: CTAB no crackles, wheeze, rhonchi Abdomen: soft/nontender/nondistended/normal bowel sounds. No rebound or guarding. Morbidly obese.  Ext: no edema Skin: warm, dry Neuro: grossly normal, moves all extremities  Assessment/Plan:  Diabetes mellitus without complication  ([facility]) S: poorly controlled On metformin 1000 mg BID and amaryl 4mg .  farxiga 5mg  (denied by insurance) CBGs- between 110-120 at night if eats well with 170-190 in the morning.  Exercise and diet- playing some golf but no regular exercise otherwise . Has tightened up diet drastically from 3 months ago Lab Results  Component Value Date   HGBA1C 8.5 01/01/2016   HGBA1C 10.2 09/21/2015   HGBA1C 8.5* 05/12/2015   A/P: we were initially planning on adding insulin with last a1c but has improved greatly with diet improvement. Would like him to be on sglt2 inhibitor but has had insurance issues. With a1c improved, will add trulicity- hopefully covered by insurance. Given 2 pens today by samples. Also increase amaryl to 8mg   Asthma S: compliant with singulair. Rarely takes his qvar. Albuterol use is 1-2x a week.  A/P: discussed if albuterol use increases any further, will need to restart the qvar.    Hypertension S: controlled. On losartan 50mg  BID and hctz 12.5mg   BP Readings from Last 3 Encounters:  01/01/16 130/72  11/03/15 166/81  10/16/15 123/63  A/P:Continue current meds:  Controlled well  Neoplasm of uncertain behavior of skin - S: lesion on left neck that started as one of his normal skin tags then developed a horned appearance on outer portion and the base grew as well. Itchy at times, right in his neckline so often gets irritated  with collar and can develop rash around it at times.  A/P: Shave biopsy completed today- will send for .  Plan: Dermatology pathology. Procedure note below  Return in about 3 months (around 04/02/2016). Return precautions advised.   Orders Placed This Encounter  Procedures  . POC HgB A1c    Meds ordered this encounter  Medications  . Dulaglutide (TRULICITY) 1.5 0000000 SOPN    Sig: Inject 1.5 ml  weekly    Dispense:  3 pen    Refill:  5  . glimepiride (AMARYL) 4 MG tablet    Sig: Take 2 tablets (8 mg total) by mouth every morning.    Dispense:  60  tablet    Refill:  5   Skin Biopsy Procedure Note   PRE-OP DIAGNOSIS: _neoplasm of uncertain behavior of skin POST-OP DIAGNOSIS: Same  PROCEDURE: skin biopsy  PROCEDURE:   Shave Biopsy          The area surrounding the skin lesion was prepared and draped in the usual sterile manner. The lesion was removed in the usual manner by the biopsy method noted above. Hemostasis was assured with pressure and silver nitrate. The patient tolerated the procedure well.  Followup: The patient tolerated the procedure well without complications.  Standard post-procedure care is explained and return precautions are given.   Garret Reddish, MD

## 2016-01-02 MED ORDER — GLIMEPIRIDE 4 MG PO TABS: 8.0000 mg | ORAL_TABLET | Freq: Every morning | ORAL | Status: DC

## 2016-01-02 NOTE — Assessment & Plan Note (Signed)
S: poorly controlled On metformin 1000 mg BID and amaryl 4mg .  farxiga 5mg  (denied by insurance) CBGs- between 110-120 at night if eats well with 170-190 in the morning.  Exercise and diet- playing some golf but no regular exercise otherwise . Has tightened up diet drastically from 3 months ago Lab Results  Component Value Date   HGBA1C 8.5 01/01/2016   HGBA1C 10.2 09/21/2015   HGBA1C 8.5* 05/12/2015   A/P: we were initially planning on adding insulin with last a1c but has improved greatly with diet improvement. Would like him to be on sglt2 inhibitor but has had insurance issues. With a1c improved, will add trulicity- hopefully covered by insurance. Given 2 pens today by samples. Also increase amaryl to 8mg 

## 2016-01-02 NOTE — Assessment & Plan Note (Signed)
S: compliant with singulair. Rarely takes his qvar. Albuterol use is 1-2x a week.  A/P: discussed if albuterol use increases any further, will need to restart the qvar.

## 2016-01-04 ENCOUNTER — Other Ambulatory Visit: Payer: Self-pay | Admitting: Family Medicine

## 2016-02-10 ENCOUNTER — Other Ambulatory Visit: Payer: Self-pay | Admitting: *Deleted

## 2016-02-10 MED ORDER — MONTELUKAST SODIUM 10 MG PO TABS: 10.0000 mg | ORAL_TABLET | Freq: Every day | ORAL | Status: DC

## 2016-04-05 ENCOUNTER — Other Ambulatory Visit: Payer: Self-pay | Admitting: Family Medicine

## 2016-06-01 ENCOUNTER — Other Ambulatory Visit: Payer: Self-pay | Admitting: Family Medicine

## 2016-06-21 ENCOUNTER — Other Ambulatory Visit: Payer: Self-pay | Admitting: Family Medicine

## 2016-07-21 IMAGING — US US ART/VEN ABD/PELV/SCROTUM DOPPLER LTD
1 series · 13 of 25 positions shown · non-contrast
Comparison: 08/07/2015

CLINICAL DATA: Right-sided testicular pain

EXAM:
SCROTAL ULTRASOUND
DOPPLER ULTRASOUND OF THE TESTICLES
TECHNIQUE: Complete ultrasound examination of the testicles, epididymis, and
other scrotal structures was performed. Color and spectral Doppler
ultrasound were also utilized to evaluate blood flow to the
testicles.

[Series 1: us art/ven abd/pelv/scrotum doppler ltd · 0.07mm/px · 70 acquisitions, 13 frames shown]
[im 1/70]
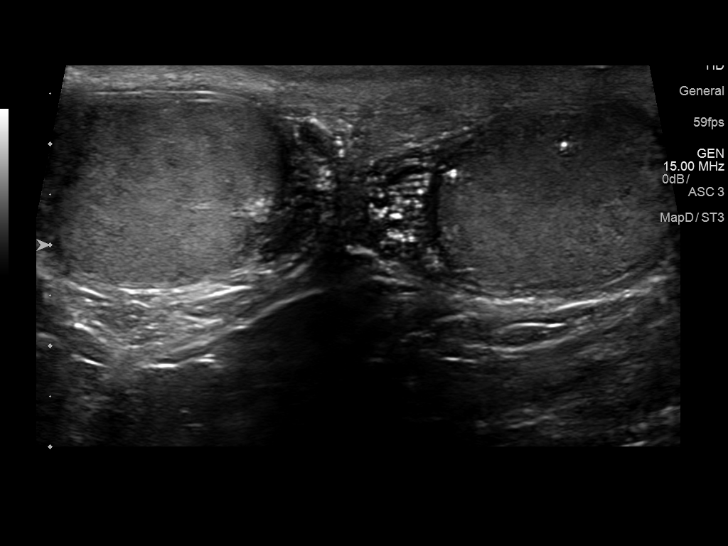
[im 6/70]
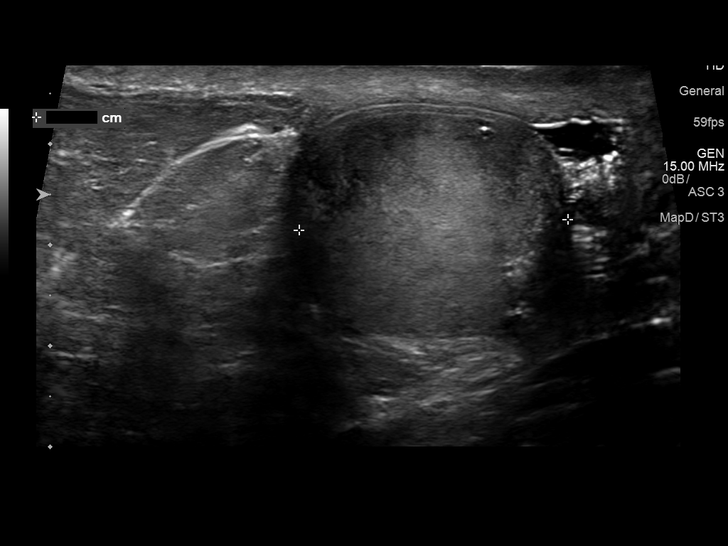
[im 12/70]
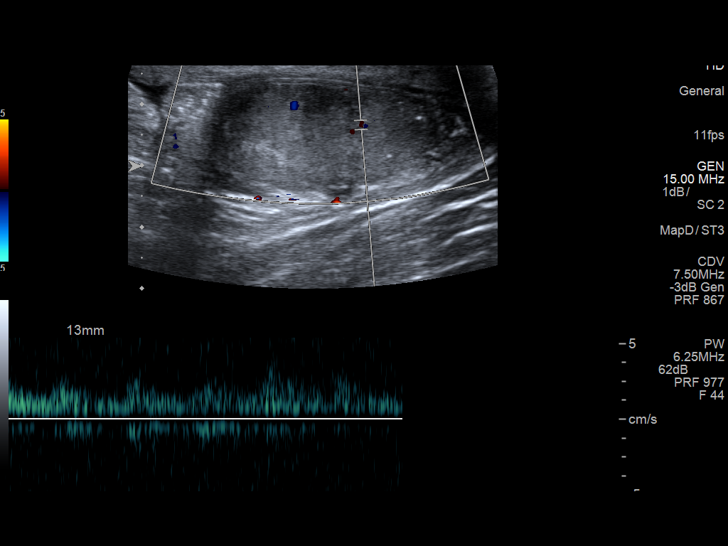
[im 18/70]
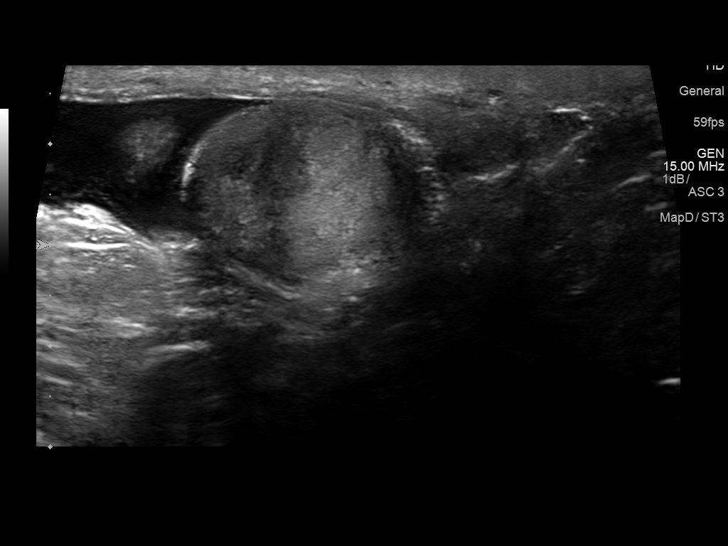
[im 24/70]
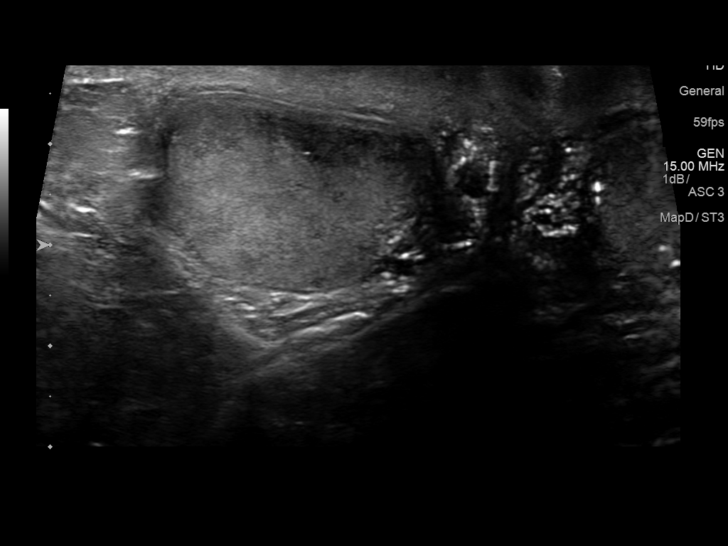
[im 29/70]
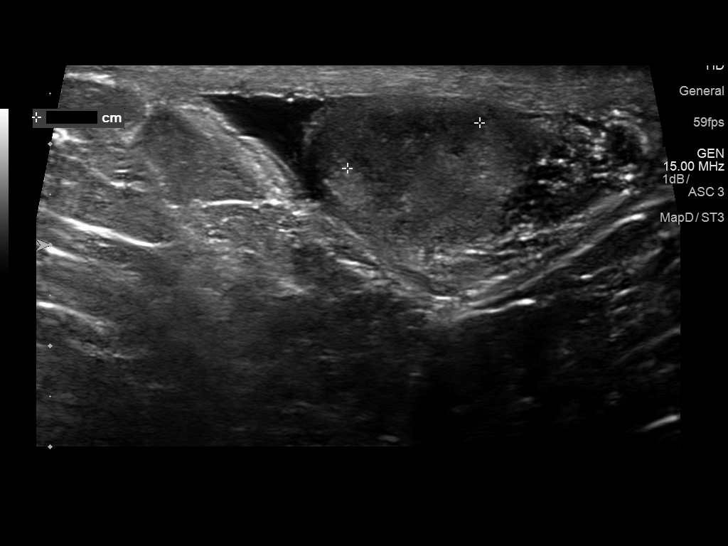
[im 35/70]
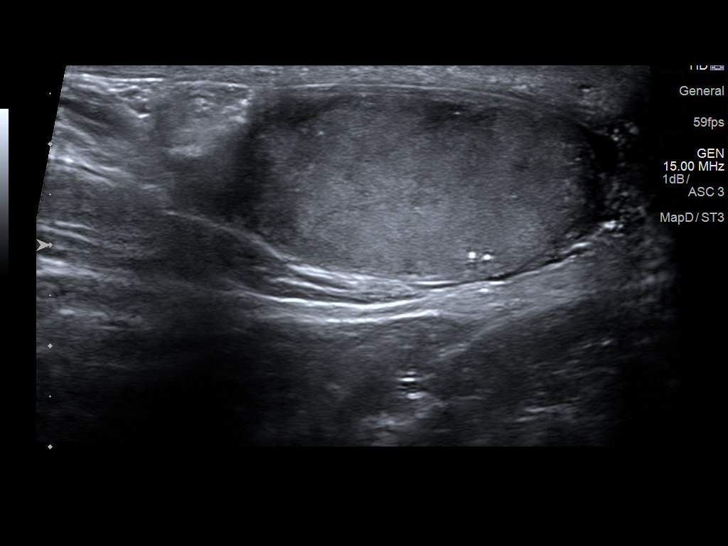
[im 41/70]
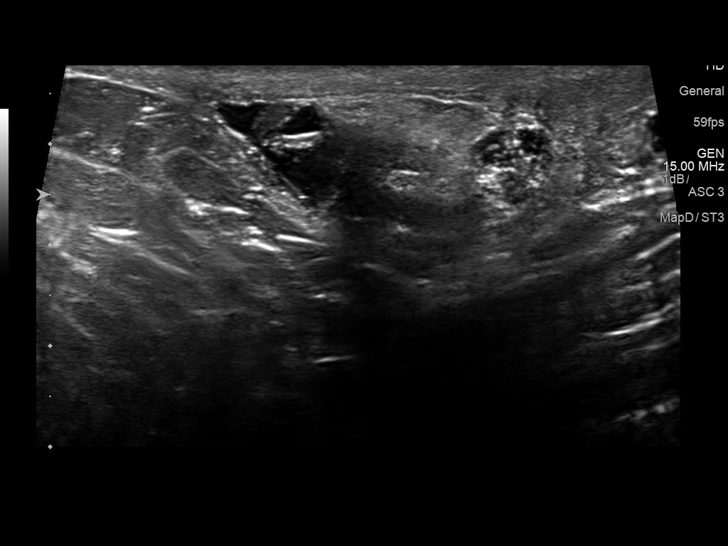
[im 47/70]
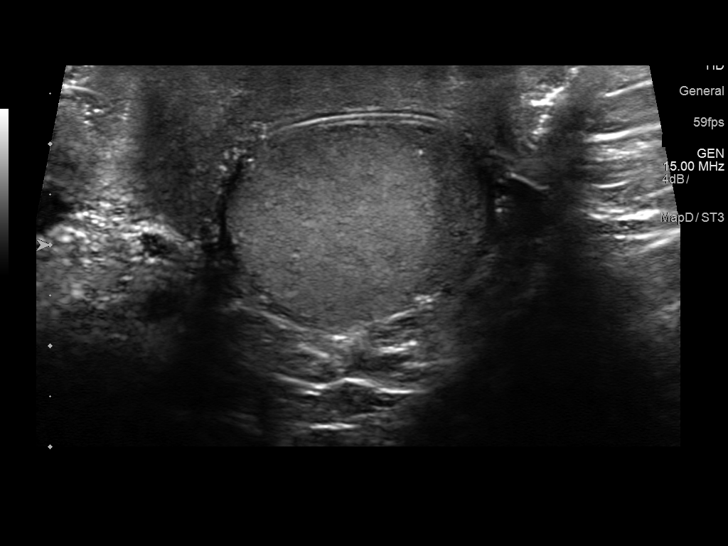
[im 52/70]
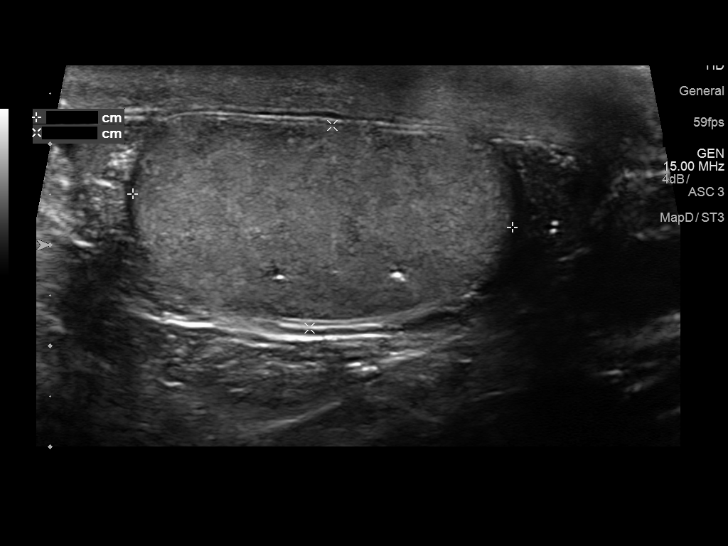
[im 58/70]
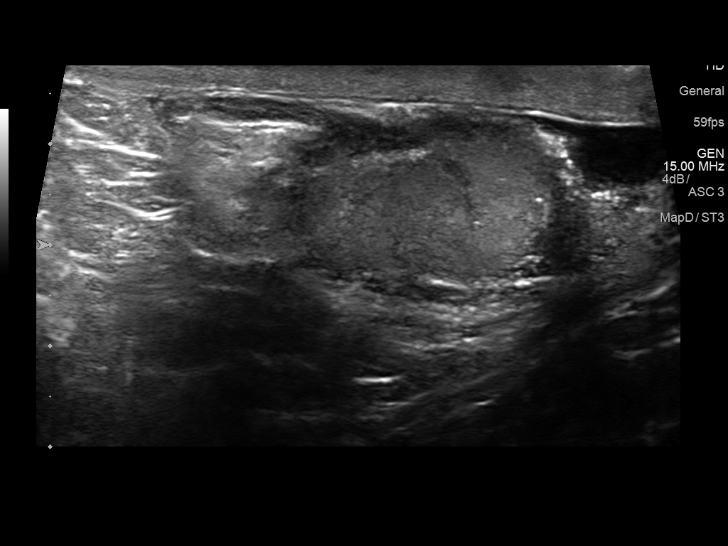
[im 64/70]
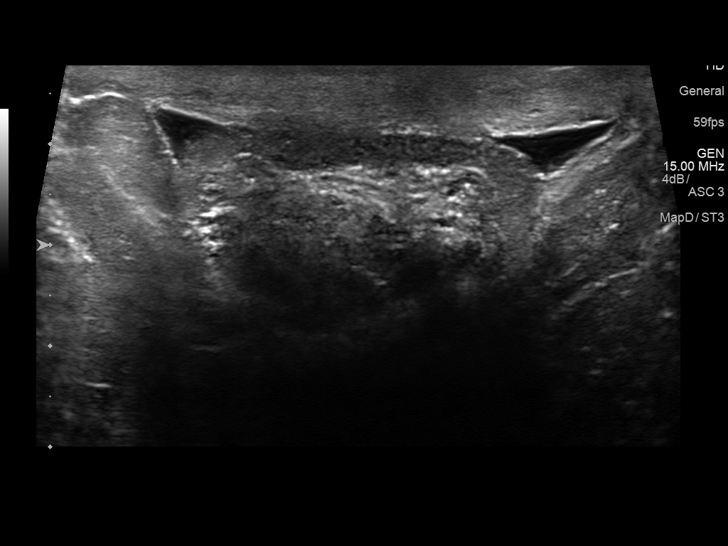
[im 70/70]
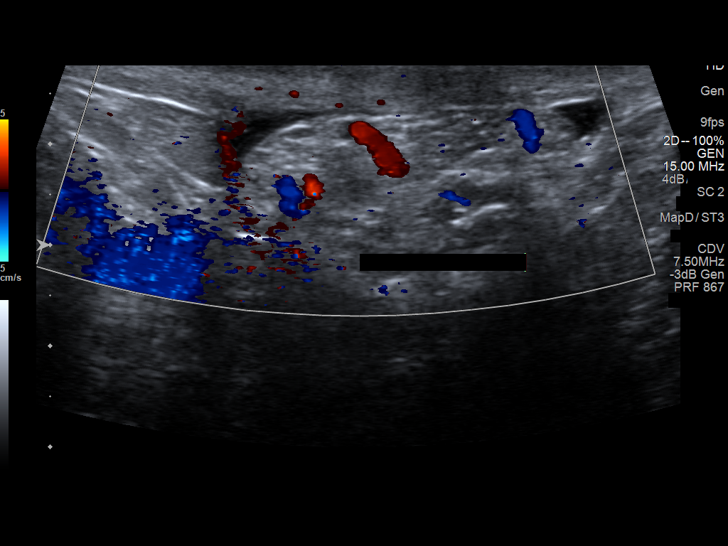

[13 of 25 positions shown; findings below may reference images not displayed]

FINDINGS: Right testicle

Measurements: 3.9 x 1.9 x 2.7 cm.. Multiple microcalcifications are
Noted. Additionally there are at least 3 hypoechoic areas identified
within the substance of the right testicle. These are stable in
appearance from the prior exam. The largest of these measures just
over 1 cm.

Left testicle

Measurements: 3.8 x 2.0 x 2.7 cm.. Multiple microcalcifications are
noted similar to that seen on the prior exam. No focal mass lesion
is noted.

Right epididymis: Small epididymal cyst is again noted measuring 4
mm in size.

Left epididymis:  Normal in size and appearance.

Hydrocele:  Small right hydrocele is again noted.

Varicocele:  None visualized.

Pulsed Doppler interrogation of both testes demonstrates normal low
resistance arterial and venous waveforms bilaterally.
IMPRESSION: Multiple hypoechoic lesions are again identified within the right
testicle similar to that seen on the prior exam. There relatively
hypovascular in nature. Differential again includes neoplasm,
infection and infarction. Infection is felt to be less likely due to
the lack of hyperemia.

Stable right epididymal cyst and right hydrocele.

Stable microcalcifications in both testicles.

## 2016-10-28 IMAGING — CR DG CHEST 2V
2 series · 2 of 2 positions shown · non-contrast
Comparison: Chest x-ray of 11/12/2014

CLINICAL DATA: History of testicular carcinoma, asthma, diabetes

EXAM:
CHEST  2 VIEW

[w chest pa *]
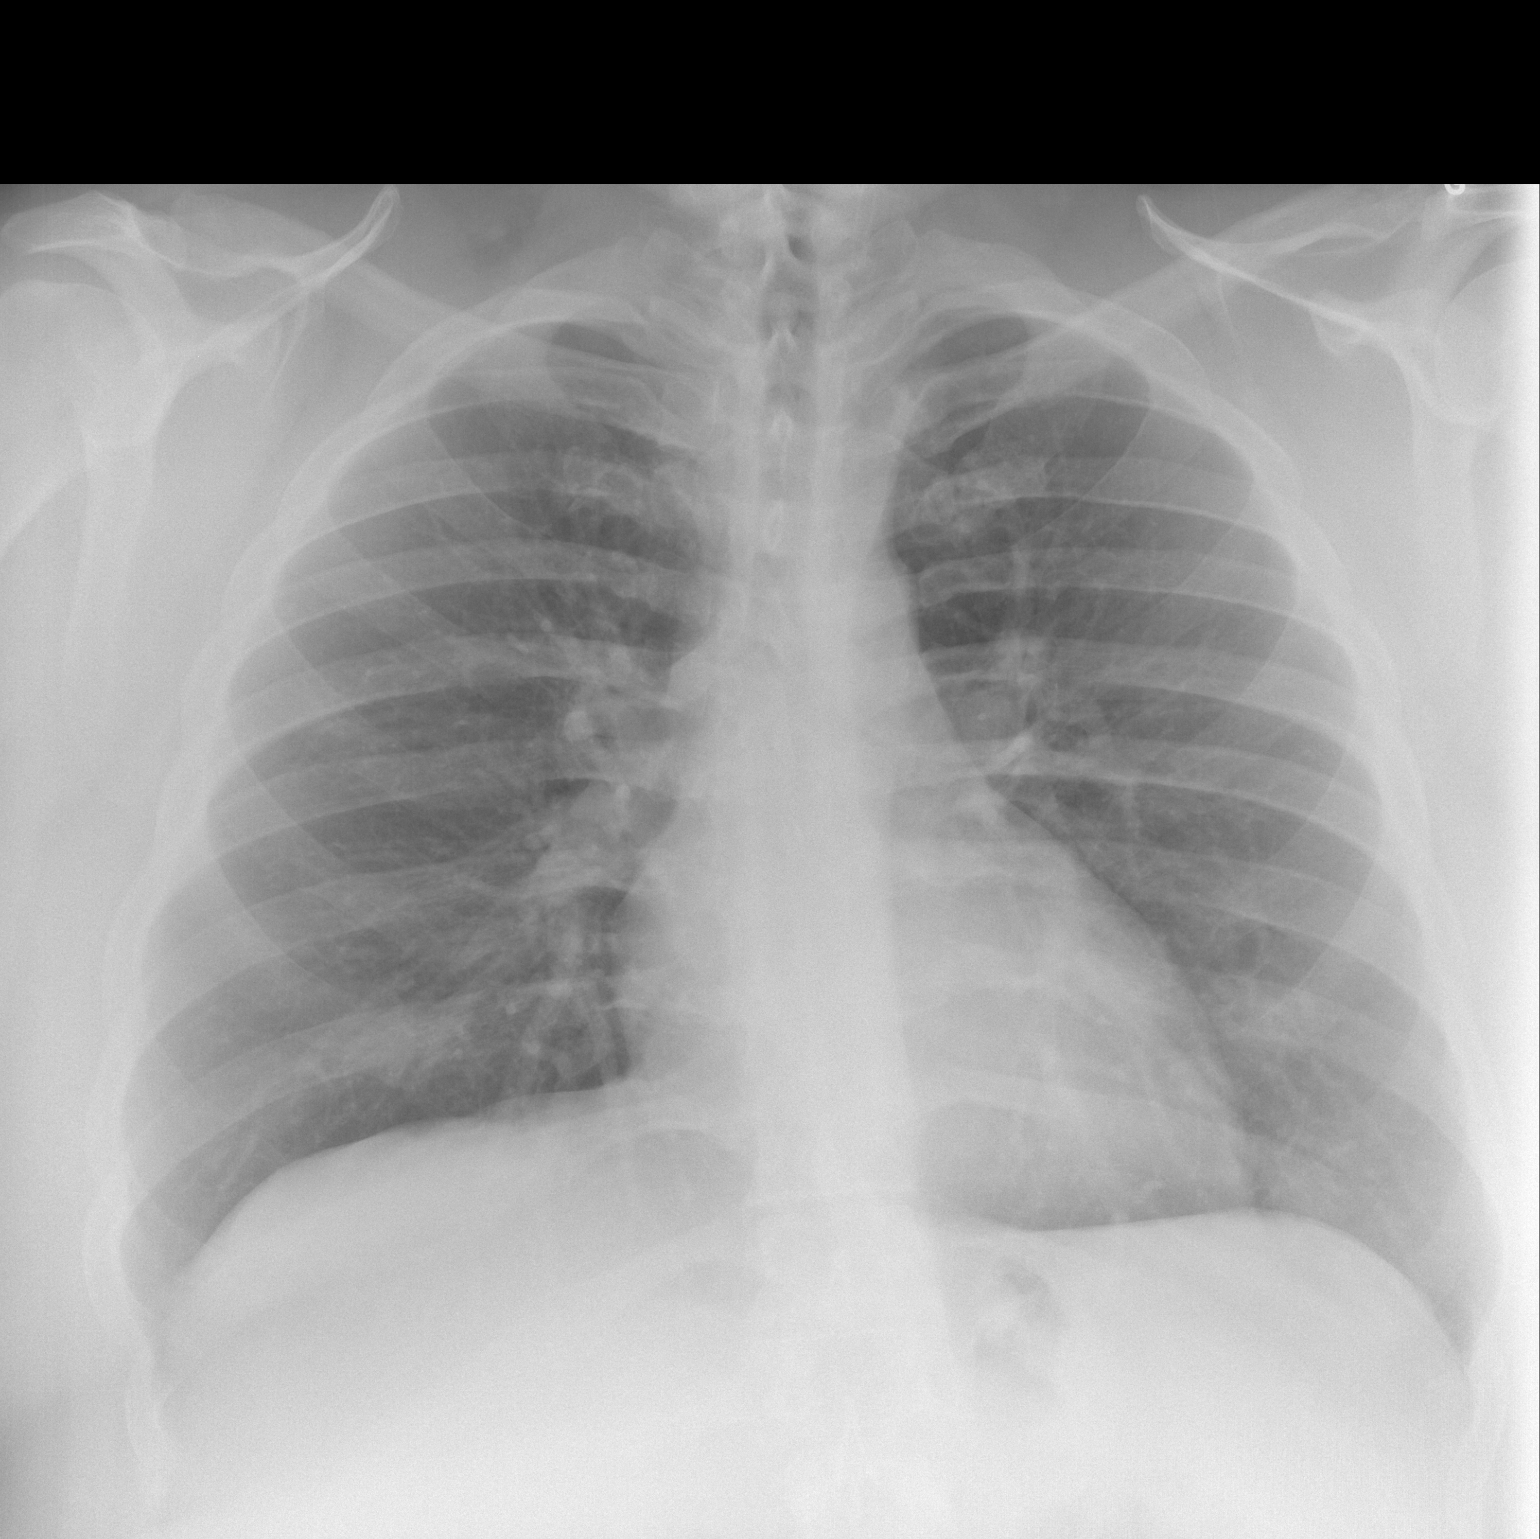

[w chest lat *]
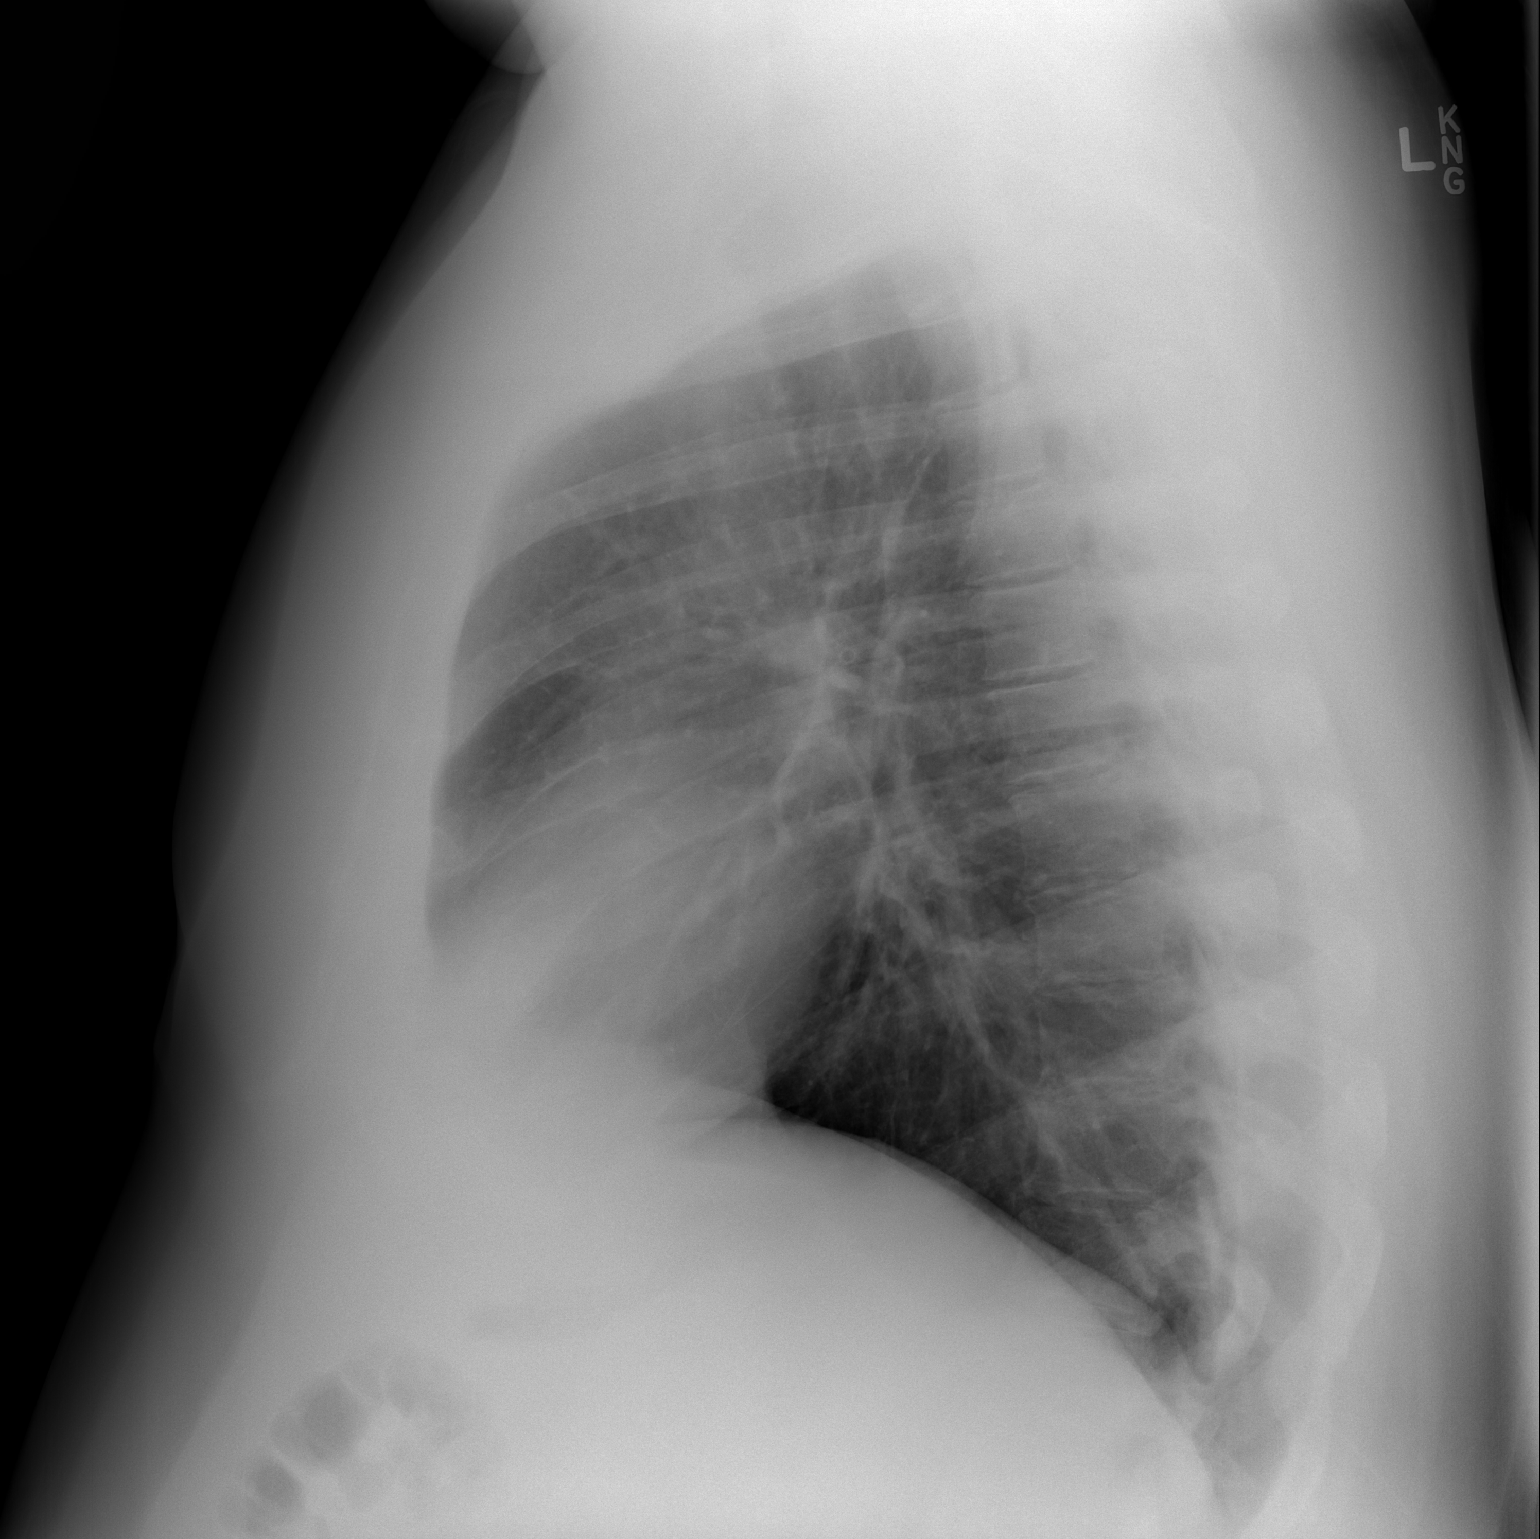

[2 of 2 positions shown; findings below may reference images not displayed]

FINDINGS: No active infiltrate or effusion is seen. There is no evidence of
metastatic involvement of the lungs. Mediastinal and hilar contours
are unremarkable. The heart is within normal limits in size. No bony
abnormality is seen.
IMPRESSION: No active cardiopulmonary disease.

## 2018-11-30 ENCOUNTER — Encounter: Payer: Self-pay | Admitting: Family Medicine

## 2019-01-01 ENCOUNTER — Telehealth: Payer: Self-pay

## 2019-01-01 NOTE — Telephone Encounter (Signed)
-----   Message from Marin Olp, MD sent at 12/31/2018  9:25 PM EDT ----- See Deloris Ping message 11/30/2018- please call patient. Please document if has switched PCPs or moved and let me know.   Thanks,  Garret Reddish

## 2019-01-01 NOTE — Telephone Encounter (Signed)
Called pt and spouse phone  but the call stated " the number is either changed or disconnected" for both numbers.  Not sure how I can reach him.

## 2019-01-01 NOTE — Telephone Encounter (Signed)
Did you try emergency contact?

## 2019-01-02 NOTE — Telephone Encounter (Signed)
Mail letter please- can basically be my mychart message to him- just change webex to [email or web address]

## 2019-01-02 NOTE — Telephone Encounter (Signed)
Noted! Letter has been mailed.
